# Patient Record
Sex: Female | Born: 1995 | Race: Black or African American | Hispanic: No | Marital: Single | Smoking: Never smoker
Health system: Southern US, Community
[De-identification: ages and names within clinical notes are randomized; demographics above are authoritative.]

## PROBLEM LIST (undated history)

## (undated) DIAGNOSIS — J45909 Unspecified asthma, uncomplicated: Secondary | ICD-10-CM

---
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
## 2013-03-21 HISTORY — PX: THERAPEUTIC ABORTION: SHX798

## 2014-01-16 IMAGING — US US PELVIS COMPLETE
1 series · 14 of 25 positions shown · non-contrast
Comparison: None

CLINICAL DATA: Irregular menstruation, menorrhagia and abdominal
cramping.



[Series 1: us pelvis complete · 0.22mm/px · 14 of 57 slices shown]
[im 1/57]
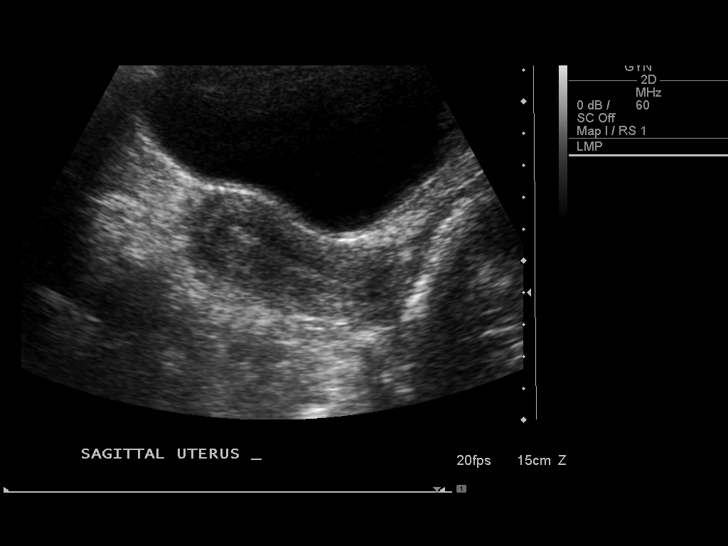
[im 5/57]
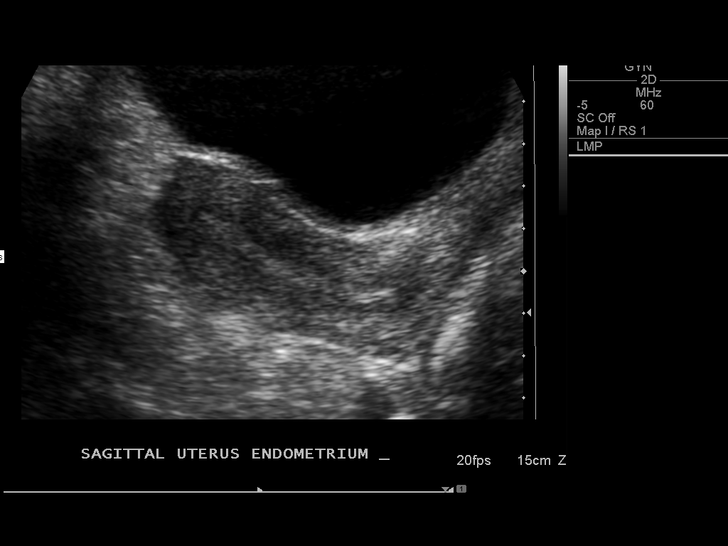
[im 10/57]
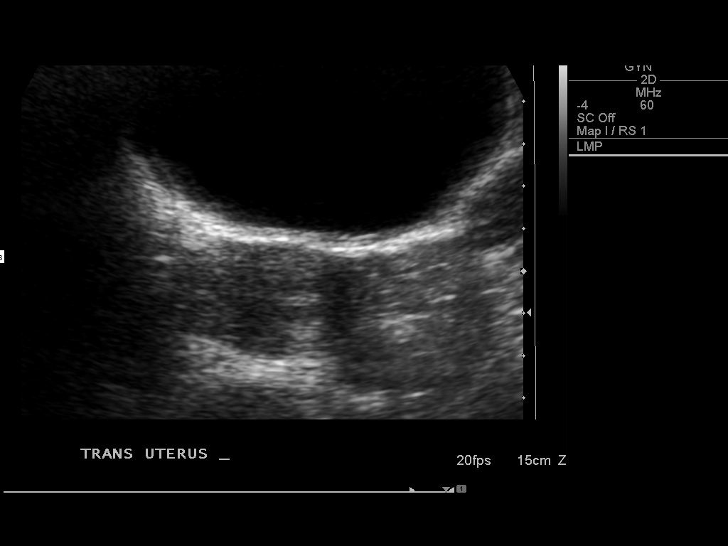
[im 15/57]
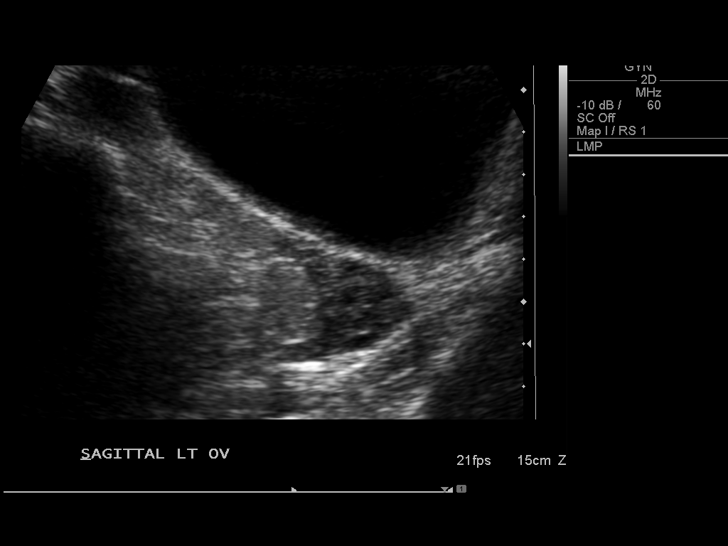
[im 19/57]
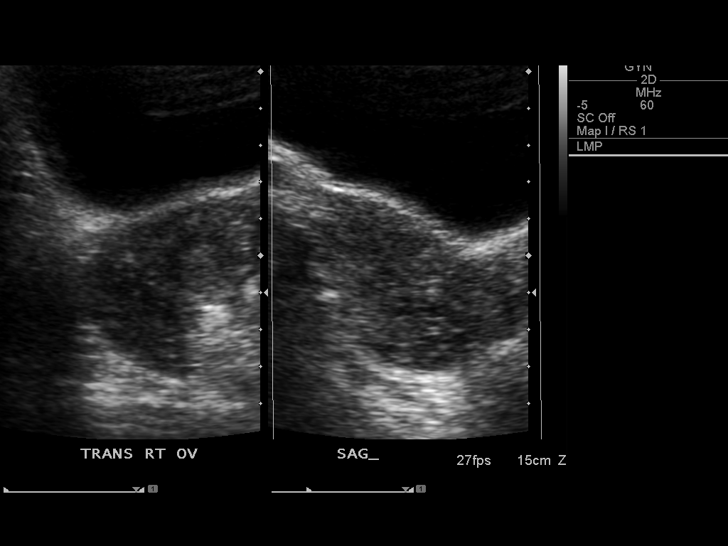
[im 22/57]
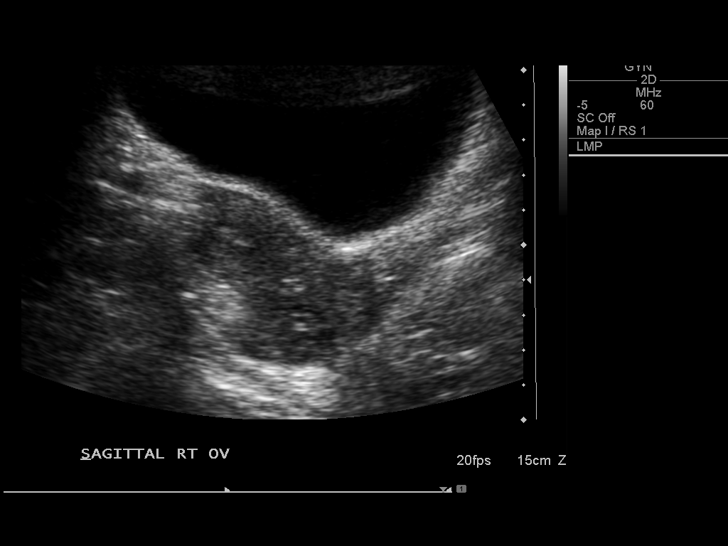
[im 26/57]
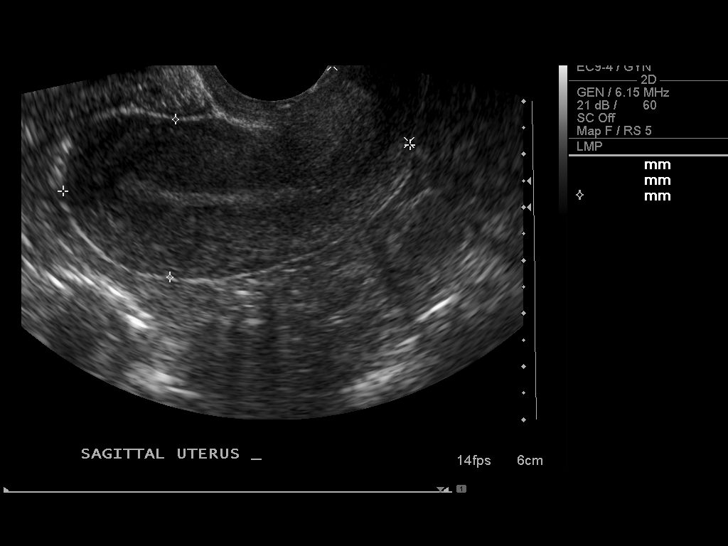
[im 31/57]
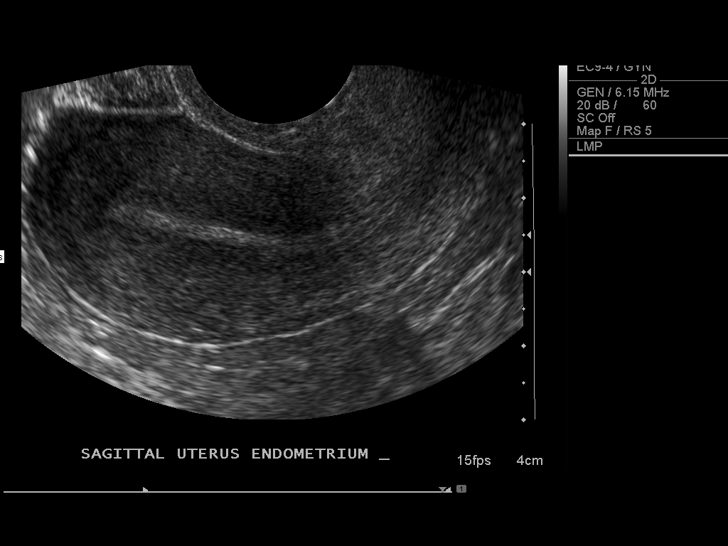
[im 36/57]
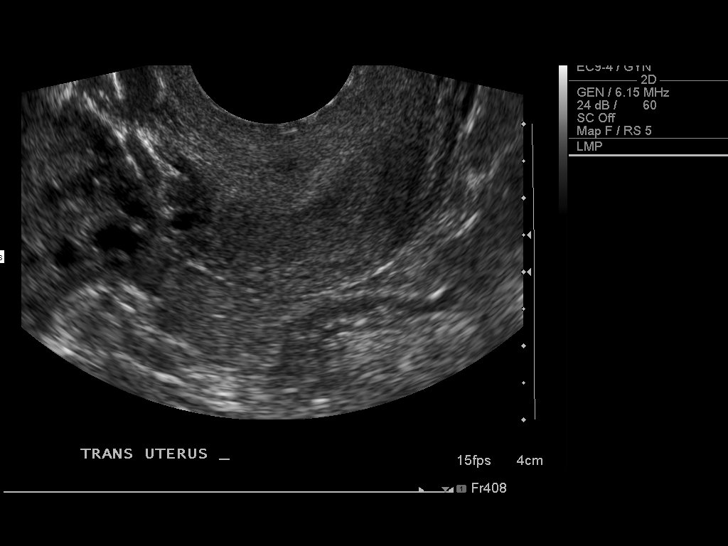
[im 38/57]
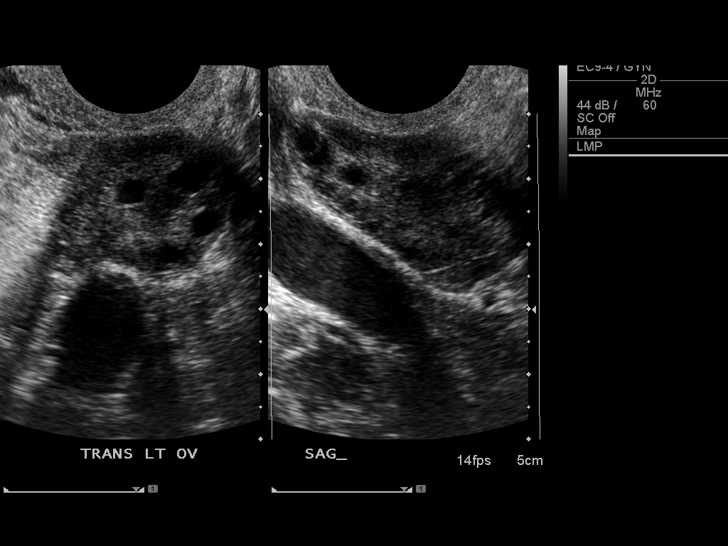
[im 43/57]
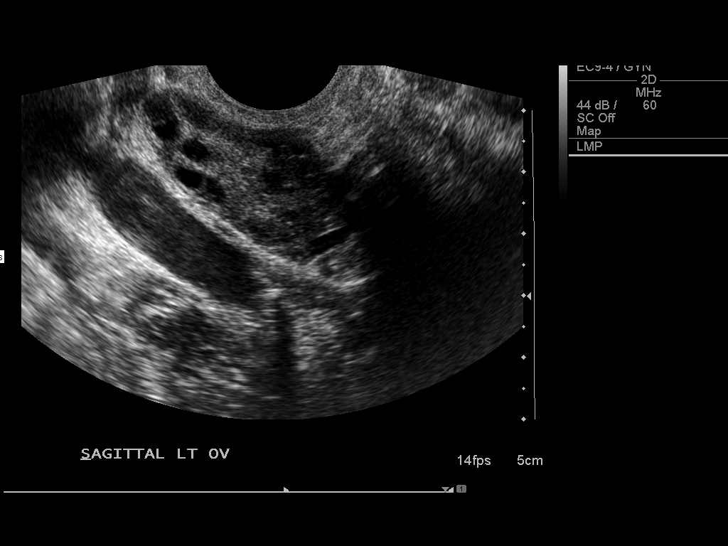
[im 47/57]
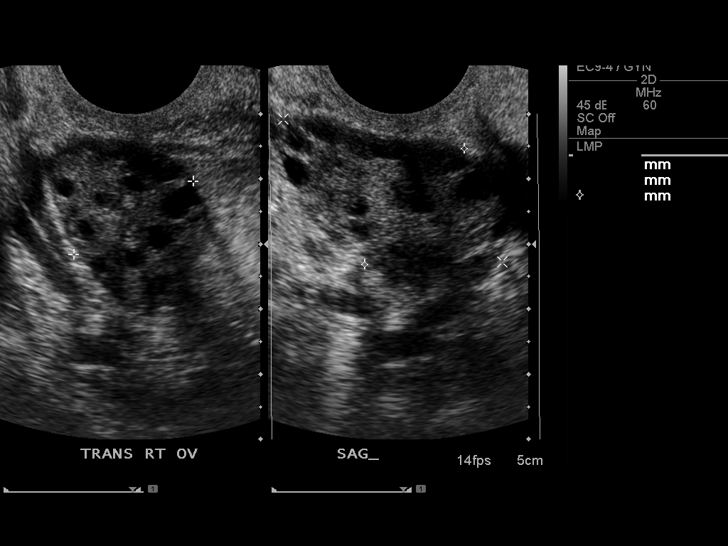
[im 52/57]
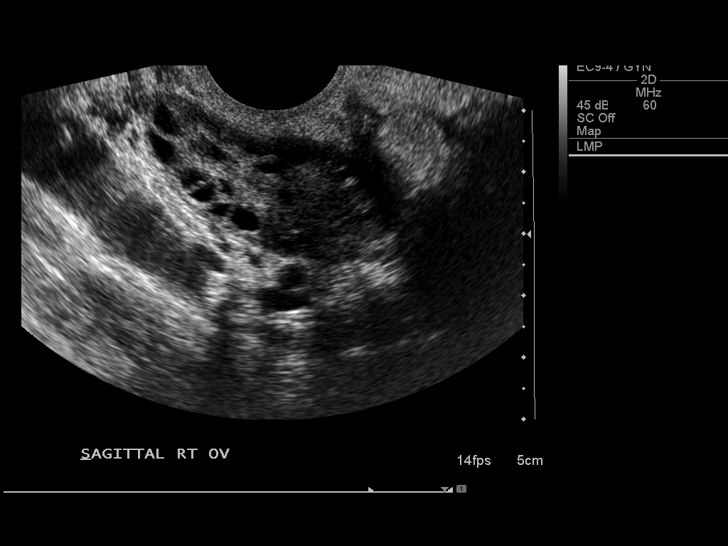
[im 57/57]
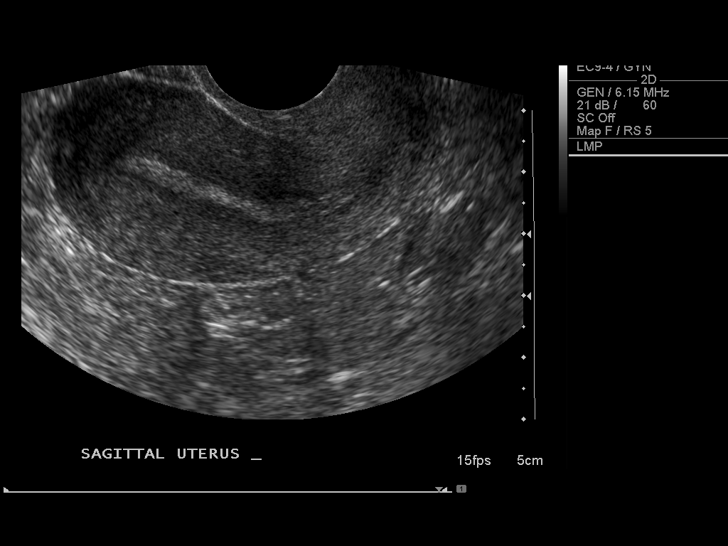

[14 of 25 positions shown; findings below may reference images not displayed]

FINDINGS: Uterus: The uterus measures approximately 6.6 x 3.0 x 4.3 cm and
has a normal sonographic appearance.

Endometrium: Normal, homogeneous endometrium measures 3 mm in
thickness.

Right ovary:  The right ovary measures 4.0 x 2.4 x 2.2 cm and
contains normal follicles.

Left ovary: The left ovary measures 4.1 x 2.0 x 2.6 cm and contains
normal follicles.

No adnexal masses are identified.

Other findings: No free fluid
IMPRESSION: Normal study. No evidence of pelvic mass or other significant
abnormality.

## 2018-03-21 NOTE — L&D Delivery Note (Addendum)
Patient: Tammy Moon MRN: [id number]  GBS status: Positive, IAP given  Patient is a 23 y.o. now G2P1011 s/p NSVD at [redacted]w[redacted]d, who was admitted for IOL due to decreased fetal movement and new onset hypertension. AROM 1h 47m prior to delivery with heavy meconium fluid.    Delivery Note At 12:26 PM a viable female was delivered via Vaginal, Spontaneous (Presentation:Vertex ;ROA ).  APGAR:8, 9 ; weight See delivery summary .   Placenta status: spontaneous , intact.  Cord:3 vessel with the following complications: 1st laceration that required repair with 3-0 Vicryl sutures  Anesthesia: Epidural Episiotomy: None Lacerations: 1st degree Suture Repair: 3.0 vicryl Est. Blood Loss (mL):  200  Mom to postpartum.  Baby to Couplet care / Skin to Skin.  Head delivered ROA. Nuchal cord present and immediately reduced. Shoulder and body delivered in usual fashion. Infant with spontaneous cry, placed on mother's abdomen, dried and bulb suctioned. Cord clamped x 2 after 1-minute delay, and cut by father of the baby member. Cord blood drawn. Placenta delivered spontaneously with gentle cord traction. Fundus firm with massage and Pitocin. Perineum inspected and found to have a first-degree perineal body laceration laceration, which was repaired with 3.0 Vicryl sutures with good hemostasis achieved.  Gifford Shave 01/07/2019, 12:52 PM   OB FELLOW DELIVERY ATTESTATION  I was gloved and present for the delivery in its entirety, and I agree with the above resident's note.    Barrington Ellison, MD [facility] Family Medicine Fellow, [facility] for [employer], [place]

## 2018-07-04 ENCOUNTER — Inpatient Hospital Stay (HOSPITAL_COMMUNITY)
Admission: AD | Admit: 2018-07-04 | Discharge: 2018-07-04 | Disposition: A | Discharge status: Home / Self Care | Payer: BLUE CROSS/BLUE SHIELD | Attending: Obstetrics and Gynecology | Admitting: Obstetrics and Gynecology

## 2018-07-04 ENCOUNTER — Other Ambulatory Visit: Payer: Self-pay

## 2018-07-04 ENCOUNTER — Encounter (HOSPITAL_COMMUNITY): Payer: Self-pay | Admitting: *Deleted

## 2018-07-04 DIAGNOSIS — O23591 Infection of other part of genital tract in pregnancy, first trimester: Secondary | ICD-10-CM | POA: Diagnosis not present

## 2018-07-04 DIAGNOSIS — R109 Unspecified abdominal pain: Secondary | ICD-10-CM | POA: Insufficient documentation

## 2018-07-04 DIAGNOSIS — O9989 Other specified diseases and conditions complicating pregnancy, childbirth and the puerperium: Secondary | ICD-10-CM | POA: Diagnosis not present

## 2018-07-04 DIAGNOSIS — B373 Candidiasis of vulva and vagina: Secondary | ICD-10-CM | POA: Diagnosis not present

## 2018-07-04 DIAGNOSIS — Z3A13 13 weeks gestation of pregnancy: Secondary | ICD-10-CM | POA: Diagnosis not present

## 2018-07-04 DIAGNOSIS — Z79899 Other long term (current) drug therapy: Secondary | ICD-10-CM | POA: Insufficient documentation

## 2018-07-04 DIAGNOSIS — Z91040 Latex allergy status: Secondary | ICD-10-CM | POA: Diagnosis not present

## 2018-07-04 DIAGNOSIS — B3731 Acute candidiasis of vulva and vagina: Secondary | ICD-10-CM

## 2018-07-04 HISTORY — DX: Unspecified asthma, uncomplicated: J45.909

## 2018-07-04 LAB — URINALYSIS, ROUTINE W REFLEX MICROSCOPIC
Bilirubin Urine: NEGATIVE
Glucose, UA: NEGATIVE mg/dL
Hgb urine dipstick: NEGATIVE
Ketones, ur: NEGATIVE mg/dL
Nitrite: NEGATIVE
Protein, ur: NEGATIVE mg/dL
Specific Gravity, Urine: 1.018 (ref 1.005–1.030)
pH: 6 (ref 5.0–8.0)

## 2018-07-04 LAB — WET PREP, GENITAL
Sperm: NONE SEEN
Trich, Wet Prep: NONE SEEN

## 2018-07-04 LAB — POCT PREGNANCY, URINE: Preg Test, Ur: POSITIVE — AB

## 2018-07-04 MED ORDER — TERCONAZOLE 0.4 % VA CREA: 1.0000 | TOPICAL_CREAM | Freq: Every day | VAGINAL | 0 refills | Status: AC

## 2018-07-04 NOTE — MAU Note (Signed)
Pt presents to MAU with complaints of lower abdominal cramping with back pain. Has more vaginal discharge than normal

## 2018-07-04 NOTE — Discharge Instructions (Signed)
[facility]  ° ° ° °[facility]     Phone: [phone] ° °[facility]  Phone: [phone] ° °[facility]  Phone: [phone] ° °[facility]                           Phone: [phone] ° °[facility]          Phone: [phone] ° °[facility]    Phone: [phone]  ° °[facility]    Phone: [phone] ° °[facility]    Phone: [phone] ° °[facility]    Phone: [phone] ° °[facility]    Phone: [phone] ° °[facility]  Phone: [phone] ° °[facility]               Phone: [phone] ° °[facility]   Phone: [phone] ° °[facility]    Phone: [phone] ° ° ° ° ° ° ° ° °  ° ° ° °  ° ° °

## 2018-07-04 NOTE — MAU Provider Note (Signed)
History     CSN: [id number]  Arrival date and time: 07/04/18 1247    Chief Complaint  Patient presents with  . Abdominal Pain   Ms. Tammy Moon is a 23 y.o. G2P0010 at [redacted]w[redacted]d who presents to MAU for vaginal discharge.  Onset: this morning Location: vagina Duration: <24hrs Character: white, thick, clumped Aggravating/Associated: none/pt denies odor and itching Relieving: none Treatment: none Severity: no pain  Pt denies VB. Pt denies N/V, abdominal pain, constipation, diarrhea, or urinary problems. Pt denies fever, chills, fatigue, sweating or changes in appetite. Pt denies SOB or chest pain. Pt denies dizziness, HA, light-headedness, weakness.  Allergies? latex Current medications/supplements? PNVs Prenatal care provider? None, pt seen at [facility] for early US  OB History    Gravida  2   Para      Term      Preterm      AB  1   Living        SAB      TAB  1   Ectopic      Multiple      Live Births              Past Medical History:  Diagnosis Date  . Asthma    as a child     Past Surgical History:  Procedure Laterality Date  . THERAPEUTIC ABORTION  2015    History reviewed. No pertinent family history.  Social History   Tobacco Use  . Smoking status: Never Smoker  . Smokeless tobacco: Never Used  Substance Use Topics  . Alcohol use: Not Currently    Comment: occ  . Drug use: Yes    Types: Marijuana    Comment: last used 1 month ago     Allergies:  Allergies  Allergen Reactions  . Latex Itching    Medications Prior to Admission  Medication Sig Dispense Refill Last Dose  . fluconazole (DIFLUCAN) 150 MG tablet Take 1 tablet (150 mg total) by mouth once. If no relief of itching in 3 days, take one tablet. 1 tablet 1   . loperamide (IMODIUM A-D) 2 MG tablet Take 1 tablet (2 mg total) by mouth 4 (four) times daily as needed for diarrhea or loose stools. 30 tablet 0     Review of Systems  Constitutional:  Negative for appetite change, chills, diaphoresis, fatigue and fever.  Respiratory: Negative for shortness of breath.   Cardiovascular: Negative for chest pain.  Gastrointestinal: Negative for abdominal pain, constipation, diarrhea, nausea and vomiting.  Genitourinary: Positive for vaginal discharge. Negative for dysuria, flank pain, frequency, pelvic pain, urgency and vaginal bleeding.  Neurological: Negative for dizziness, weakness, light-headedness and headaches.   Physical Exam   Blood pressure 131/66, pulse 93, temperature 97.9 F (36.6 C), resp. rate 16, height 5\' 10"  (1.778 m), weight 76.2 kg, last menstrual period 03/31/2018.   Patient Vitals for the past 24 hrs:  BP Temp Pulse Resp Height Weight  07/04/18 1356 131/66 97.9 F (36.6 C) 93 16 5\' 10"  (1.778 m) 76.2 kg    Physical Exam  Constitutional: She is oriented to person, place, and time. She appears well-developed and well-nourished. No distress.  HENT:  Head: Normocephalic and atraumatic.  Respiratory: Effort normal.  Neurological: She is alert and oriented to person, place, and time.  Skin: Skin is warm and dry. She is not diaphoretic.  Psychiatric: She has a normal mood and affect. Her behavior is normal.  FHTs 140s  Results for orders placed  or performed during the hospital encounter of 07/04/18 (from the past 24 hour(s))  Pregnancy, urine POC     Status: Abnormal   Collection Time: 07/04/18  1:29 PM  Result Value Ref Range   Preg Test, Ur POSITIVE (A) NEGATIVE  Urinalysis, Routine w reflex microscopic     Status: Abnormal   Collection Time: 07/04/18  1:43 PM  Result Value Ref Range   Color, Urine YELLOW YELLOW   APPearance HAZY (A) CLEAR   Specific Gravity, Urine 1.018 1.005 - 1.030   pH 6.0 5.0 - 8.0   Glucose, UA NEGATIVE NEGATIVE mg/dL   Hgb urine dipstick NEGATIVE NEGATIVE   Bilirubin Urine NEGATIVE NEGATIVE   Ketones, ur NEGATIVE NEGATIVE mg/dL   Protein, ur NEGATIVE NEGATIVE mg/dL   Nitrite NEGATIVE  NEGATIVE   Leukocytes,Ua SMALL (A) NEGATIVE   RBC / HPF 0-5 0 - 5 RBC/hpf   WBC, UA 6-10 0 - 5 WBC/hpf   Bacteria, UA RARE (A) NONE SEEN   Squamous Epithelial / LPF 0-5 0 - 5   Mucus PRESENT   Wet prep, genital     Status: Abnormal   Collection Time: 07/04/18  3:13 PM  Result Value Ref Range   Yeast Wet Prep HPF POC PRESENT (A) NONE SEEN   Trich, Wet Prep NONE SEEN NONE SEEN   Clue Cells Wet Prep HPF POC PRESENT (A) NONE SEEN   WBC, Wet Prep HPF POC MODERATE (A) NONE SEEN   Sperm NONE SEEN    No results found.  MAU Course  Procedures  MDM -WetPrep (self-swab): +yeast, +ClueCells (isolated findings, no tx necessary), + WBCs -GC/CT collected by self-swab -UA: hazy/small leuks/rare bacteria -pt discharged to home in stable condition  Orders Placed This Encounter  Procedures  . Wet prep, genital    Standing Status:   Standing    Number of Occurrences:   1  . Culture, OB Urine    Standing Status:   Standing    Number of Occurrences:   1  . Urinalysis, Routine w reflex microscopic    Standing Status:   Standing    Number of Occurrences:   1  . Pregnancy, urine POC    Standing Status:   Standing    Number of Occurrences:   1  . Discharge patient    Order Specific Question:   Discharge disposition    Answer:   01-Home or Self Care [1]    Order Specific Question:   Discharge patient date    Answer:   07/04/2018   Meds ordered this encounter  Medications  . terconazole (TERAZOL 7) 0.4 % vaginal cream    Sig: Place 1 applicator vaginally at bedtime for 7 days.    Dispense:  45 g    Refill:  0    Order Specific Question:   Supervising Provider    Answer:   ERVIN, MICHAEL L [[id number]]     Assessment and Plan   1. Vaginal yeast infection    Allergies as of 07/04/2018      Reactions   Latex Itching      Medication List    STOP taking these medications   fluconazole 150 MG tablet Commonly known as:  DIFLUCAN   loperamide 2 MG tablet Commonly known as:  Imodium  A-D     TAKE these medications   terconazole 0.4 % vaginal cream Commonly known as:  TERAZOL 7 Place 1 applicator vaginally at bedtime for 7 days.      -  list of [place] [facility] providers given, pt to schedule visit this week -start PNVs -return MAU precautions given -pt discharged to home in stable condition  Joni Reining E  07/04/2018, 4:16 PM

## 2018-07-05 LAB — GC/CHLAMYDIA PROBE AMP (~~LOC~~) NOT AT ARMC
Chlamydia: NEGATIVE
Neisseria Gonorrhea: NEGATIVE

## 2018-07-06 LAB — CULTURE, OB URINE: Culture: >=100000 — AB

## 2018-07-07 ENCOUNTER — Telehealth: Payer: Self-pay | Admitting: Student

## 2018-07-07 DIAGNOSIS — O2342 Unspecified infection of urinary tract in pregnancy, second trimester: Secondary | ICD-10-CM

## 2018-07-07 MED ORDER — NITROFURANTOIN MONOHYD MACRO 100 MG PO CAPS: 100.0000 mg | ORAL_CAPSULE | Freq: Two times a day (BID) | ORAL | 0 refills | Status: DC

## 2018-07-07 NOTE — Telephone Encounter (Signed)
Verified by name & DOB. Notified of positive urine culture. Abx sent to pharmacy of patient's choice. Allergies reviewed. Patient verbalized understanding & had no further questions.   Judeth Horn, NP

## 2018-07-12 ENCOUNTER — Encounter: Payer: Self-pay | Admitting: Obstetrics and Gynecology

## 2018-07-12 ENCOUNTER — Other Ambulatory Visit: Payer: Self-pay

## 2018-07-12 ENCOUNTER — Ambulatory Visit (INDEPENDENT_AMBULATORY_CARE_PROVIDER_SITE_OTHER): Payer: BLUE CROSS/BLUE SHIELD | Admitting: Obstetrics and Gynecology

## 2018-07-12 VITALS — BP 116/74 | HR 89 | Wt 171.3 lb

## 2018-07-12 DIAGNOSIS — Z3481 Encounter for supervision of other normal pregnancy, first trimester: Secondary | ICD-10-CM | POA: Diagnosis not present

## 2018-07-12 DIAGNOSIS — O2340 Unspecified infection of urinary tract in pregnancy, unspecified trimester: Secondary | ICD-10-CM | POA: Insufficient documentation

## 2018-07-12 DIAGNOSIS — Z124 Encounter for screening for malignant neoplasm of cervix: Secondary | ICD-10-CM

## 2018-07-12 DIAGNOSIS — O2341 Unspecified infection of urinary tract in pregnancy, first trimester: Secondary | ICD-10-CM

## 2018-07-12 DIAGNOSIS — Z3480 Encounter for supervision of other normal pregnancy, unspecified trimester: Secondary | ICD-10-CM | POA: Insufficient documentation

## 2018-07-12 DIAGNOSIS — Z3A14 14 weeks gestation of pregnancy: Secondary | ICD-10-CM

## 2018-07-12 LAB — OB RESULTS CONSOLE HEPATITIS B SURFACE ANTIGEN: Hepatitis B Surface Ag: NEGATIVE

## 2018-07-12 NOTE — Progress Notes (Signed)
Subjective:  Tammy Moon is a 23 y.o. G2P0010 at [redacted]w[redacted]d being seen today for her first OB appt. Certain LMP. No chronic medical problems or medications. Dx with UTI in [place]. Has not started treatment yet. ongoing   She is currently monitored for the following issues for this low-risk pregnancy and has Supervision of other normal pregnancy, antepartum and UTI (urinary tract infection) during pregnancy on their problem list.  Patient reports no complaints.  Contractions: Not present. Vag. Bleeding: None.  Movement: Absent. Denies leaking of fluid.   The following portions of the patient's history were reviewed and updated as appropriate: allergies, current medications, past family history, past medical history, past social history, past surgical history and problem list. Problem list updated.  Objective:   Vitals:   07/12/18 0914  BP: 116/74  Pulse: 89  Weight: 171 lb 4.8 oz (77.7 kg)    Fetal Status: Fetal Heart Rate (bpm): 150   Movement: Absent     General:  Alert, oriented and cooperative. Patient is in no acute distress.  Skin: Skin is warm and dry. No rash noted.   Cardiovascular: Normal heart rate noted  Respiratory: Normal respiratory effort, no problems with respiration noted  Abdomen: Soft, gravid, appropriate for gestational age. Pain/Pressure: Absent     Pelvic:  Cervical exam performed        Extremities: Normal range of motion.  Edema: None  Mental Status: Normal mood and affect. Normal behavior. Normal judgment and thought content.   Urinalysis:      Assessment and Plan:  Pregnancy: G2P0010 at [redacted]w[redacted]d  1. Supervision of other normal pregnancy, antepartum Prenatal care and labs reviewed with pt. - Obstetric Panel, Including HIV - Genetic Screening - CH[facility] AMB BABYSCRIPTS SCHEDULE OPTIMIZATION - Cytology - PAP - [place] MFM OB COMP + 14 WK; Future  2. Urinary tract infection in mother during first trimester of pregnancy Pt encouraged to pick up Rx today  Preterm labor  symptoms and general obstetric precautions including but not limited to vaginal bleeding, contractions, leaking of fluid and fetal movement were reviewed in detail with the patient. Please refer to After Visit Summary for other counseling recommendations.  Return in about 6 weeks (around 08/23/2018) for OB visit, televisit.   Hermina Staggers, MD

## 2018-07-12 NOTE — Progress Notes (Signed)
Pt presents for NOB visit. This is not a planned pregnancy but FOB is supportive.  CT/GC neg 07/04/18 Will pick up UTI meds today

## 2018-07-12 NOTE — Patient Instructions (Signed)
Second Trimester of Pregnancy The second trimester is from week 14 through week 27 (months 4 through 6). The second trimester is often a time when you feel your best. Your body has adjusted to being pregnant, and you begin to feel better physically. Usually, morning sickness has lessened or quit completely, you may have more energy, and you may have an increase in appetite. The second trimester is also a time when the fetus is growing rapidly. At the end of the sixth month, the fetus is about 9 inches long and weighs about 1 pounds. You will likely begin to feel the baby move (quickening) between 16 and 20 weeks of pregnancy. Body changes during your second trimester Your body continues to go through many changes during your second trimester. The changes vary from woman to woman.  Your weight will continue to increase. You will notice your lower abdomen bulging out.  You may begin to get stretch marks on your hips, abdomen, and breasts.  You may develop headaches that can be relieved by medicines. The medicines should be approved by your health care provider.  You may urinate more often because the fetus is pressing on your bladder.  You may develop or continue to have heartburn as a result of your pregnancy.  You may develop constipation because certain hormones are causing the muscles that push waste through your intestines to slow down.  You may develop hemorrhoids or swollen, bulging veins (varicose veins).  You may have back pain. This is caused by: ? Weight gain. ? Pregnancy hormones that are relaxing the joints in your pelvis. ? A shift in weight and the muscles that support your balance.  Your breasts will continue to grow and they will continue to become tender.  Your gums may bleed and may be sensitive to brushing and flossing.  Dark spots or blotches (chloasma, mask of pregnancy) may develop on your face. This will likely fade after the baby is born.  A dark line from your  belly button to the pubic area (linea nigra) may appear. This will likely fade after the baby is born.  You may have changes in your hair. These can include thickening of your hair, rapid growth, and changes in texture. Some women also have hair loss during or after pregnancy, or hair that feels dry or thin. Your hair will most likely return to normal after your baby is born. What to expect at prenatal visits During a routine prenatal visit:  You will be weighed to make sure you and the fetus are growing normally.  Your blood pressure will be taken.  Your abdomen will be measured to track your baby's growth.  The fetal heartbeat will be listened to.  Any test results from the previous visit will be discussed. Your health care provider may ask you:  How you are feeling.  If you are feeling the baby move.  If you have had any abnormal symptoms, such as leaking fluid, bleeding, severe headaches, or abdominal cramping.  If you are using any tobacco products, including cigarettes, chewing tobacco, and electronic cigarettes.  If you have any questions. Other tests that may be performed during your second trimester include:  Blood tests that check for: ? Low iron levels (anemia). ? High blood sugar that affects pregnant women (gestational diabetes) between 35 and 28 weeks. ? Rh antibodies. This is to check for a protein on red blood cells (Rh factor).  Urine tests to check for infections, diabetes, or protein in the  urine.  An ultrasound to confirm the proper growth and development of the baby.  An amniocentesis to check for possible genetic problems.  Fetal screens for spina bifida and Down syndrome.  HIV (human immunodeficiency virus) testing. Routine prenatal testing includes screening for HIV, unless you choose not to have this test. Follow these instructions at home: Medicines  Follow your health care provider's instructions regarding medicine use. Specific medicines may be  either safe or unsafe to take during pregnancy.  Take a prenatal vitamin that contains at least 600 micrograms (mcg) of folic acid.  If you develop constipation, try taking a stool softener if your health care provider approves. Eating and drinking   Eat a balanced diet that includes fresh fruits and vegetables, whole grains, good sources of protein such as meat, eggs, or tofu, and low-fat dairy. Your health care provider will help you determine the amount of weight gain that is right for you.  Avoid raw meat and uncooked cheese. These carry germs that can cause birth defects in the baby.  If you have low calcium intake from food, talk to your health care provider about whether you should take a daily calcium supplement.  Limit foods that are high in fat and processed sugars, such as fried and sweet foods.  To prevent constipation: ? Drink enough fluid to keep your urine clear or pale yellow. ? Eat foods that are high in fiber, such as fresh fruits and vegetables, whole grains, and beans. Activity  Exercise only as directed by your health care provider. Most women can continue their usual exercise routine during pregnancy. Try to exercise for 30 minutes at least 5 days a week. Stop exercising if you experience uterine contractions.  Avoid heavy lifting, wear low heel shoes, and practice good posture.  A sexual relationship may be continued unless your health care provider directs you otherwise. Relieving pain and discomfort  Wear a good support bra to prevent discomfort from breast tenderness.  Take warm sitz baths to soothe any pain or discomfort caused by hemorrhoids. Use hemorrhoid cream if your health care provider approves.  Rest with your legs elevated if you have leg cramps or low back pain.  If you develop varicose veins, wear support hose. Elevate your feet for 15 minutes, 3-4 times a day. Limit salt in your diet. Prenatal Care  Write down your questions. Take them to  your prenatal visits.  Keep all your prenatal visits as told by your health care provider. This is important. Safety  Wear your seat belt at all times when driving.  Make a list of emergency phone numbers, including numbers for family, friends, the hospital, and police and fire departments. General instructions  Ask your health care provider for a referral to a local prenatal education class. Begin classes no later than the beginning of month 6 of your pregnancy.  Ask for help if you have counseling or nutritional needs during pregnancy. Your health care provider can offer advice or refer you to specialists for help with various needs.  Do not use hot tubs, steam rooms, or saunas.  Do not douche or use tampons or scented sanitary pads.  Do not cross your legs for long periods of time.  Avoid cat litter boxes and soil used by cats. These carry germs that can cause birth defects in the baby and possibly loss of the fetus by miscarriage or stillbirth.  Avoid all smoking, herbs, alcohol, and unprescribed drugs. Chemicals in these products can affect the formation  and growth of the baby.  Do not use any products that contain nicotine or tobacco, such as cigarettes and e-cigarettes. If you need help quitting, ask your health care provider.  Visit your dentist if you have not gone yet during your pregnancy. Use a soft toothbrush to brush your teeth and be gentle when you floss. Contact a health care provider if:  You have dizziness.  You have mild pelvic cramps, pelvic pressure, or nagging pain in the abdominal area.  You have persistent nausea, vomiting, or diarrhea.  You have a bad smelling vaginal discharge.  You have pain when you urinate. Get help right away if:  You have a fever.  You are leaking fluid from your vagina.  You have spotting or bleeding from your vagina.  You have severe abdominal cramping or pain.  You have rapid weight gain or weight loss.  You have  shortness of breath with chest pain.  You notice sudden or extreme swelling of your face, hands, ankles, feet, or legs.  You have not felt your baby move in over an hour.  You have severe headaches that do not go away when you take medicine.  You have vision changes. Summary  The second trimester is from week 14 through week 27 (months 4 through 6). It is also a time when the fetus is growing rapidly.  Your body goes through many changes during pregnancy. The changes vary from woman to woman.  Avoid all smoking, herbs, alcohol, and unprescribed drugs. These chemicals affect the formation and growth your baby.  Do not use any tobacco products, such as cigarettes, chewing tobacco, and e-cigarettes. If you need help quitting, ask your health care provider.  Contact your health care provider if you have any questions. Keep all prenatal visits as told by your health care provider. This is important. This information is not intended to replace advice given to you by your health care provider. Make sure you discuss any questions you have with your health care provider. Document Released: 03/01/2001 Document Revised: 04/12/2016 Document Reviewed: 04/12/2016 Elsevier Interactive Patient Education  2019 [employer] First Trimester of Pregnancy The first trimester of pregnancy is from week 1 until the end of week 13 (months 1 through 3). A week after a sperm fertilizes an egg, the egg will implant on the wall of the uterus. This embryo will begin to develop into a baby. Genes from you and your partner will form the baby. The female genes will determine whether the baby will be a boy or a girl. At 6-8 weeks, the eyes and face will be formed, and the heartbeat can be seen on ultrasound. At the end of 12 weeks, all the baby's organs will be formed. Now that you are pregnant, you will want to do everything you can to have a healthy baby. Two of the most important things are to get good prenatal care and  to follow your health care provider's instructions. Prenatal care is all the medical care you receive before the baby's birth. This care will help prevent, find, and treat any problems during the pregnancy and childbirth. Body changes during your first trimester Your body goes through many changes during pregnancy. The changes vary from woman to woman.  You may gain or lose a couple of pounds at first.  You may feel sick to your stomach (nauseous) and you may throw up (vomit). If the vomiting is uncontrollable, call your health care provider.  You may tire easily.  You  may develop headaches that can be relieved by medicines. All medicines should be approved by your health care provider.  You may urinate more often. Painful urination may mean you have a bladder infection.  You may develop heartburn as a result of your pregnancy.  You may develop constipation because certain hormones are causing the muscles that push stool through your intestines to slow down.  You may develop hemorrhoids or swollen veins (varicose veins).  Your breasts may begin to grow larger and become tender. Your nipples may stick out more, and the tissue that surrounds them (areola) may become darker.  Your gums may bleed and may be sensitive to brushing and flossing.  Dark spots or blotches (chloasma, mask of pregnancy) may develop on your face. This will likely fade after the baby is born.  Your menstrual periods will stop.  You may have a loss of appetite.  You may develop cravings for certain kinds of food.  You may have changes in your emotions from day to day, such as being excited to be pregnant or being concerned that something may go wrong with the pregnancy and baby.  You may have more vivid and strange dreams.  You may have changes in your hair. These can include thickening of your hair, rapid growth, and changes in texture. Some women also have hair loss during or after pregnancy, or hair that  feels dry or thin. Your hair will most likely return to normal after your baby is born. What to expect at prenatal visits During a routine prenatal visit:  You will be weighed to make sure you and the baby are growing normally.  Your blood pressure will be taken.  Your abdomen will be measured to track your baby's growth.  The fetal heartbeat will be listened to between weeks 10 and 14 of your pregnancy.  Test results from any previous visits will be discussed. Your health care provider may ask you:  How you are feeling.  If you are feeling the baby move.  If you have had any abnormal symptoms, such as leaking fluid, bleeding, severe headaches, or abdominal cramping.  If you are using any tobacco products, including cigarettes, chewing tobacco, and electronic cigarettes.  If you have any questions. Other tests that may be performed during your first trimester include:  Blood tests to find your blood type and to check for the presence of any previous infections. The tests will also be used to check for low iron levels (anemia) and protein on red blood cells (Rh antibodies). Depending on your risk factors, or if you previously had diabetes during pregnancy, you may have tests to check for high blood sugar that affects pregnant women (gestational diabetes).  Urine tests to check for infections, diabetes, or protein in the urine.  An ultrasound to confirm the proper growth and development of the baby.  Fetal screens for spinal cord problems (spina bifida) and Down syndrome.  HIV (human immunodeficiency virus) testing. Routine prenatal testing includes screening for HIV, unless you choose not to have this test.  You may need other tests to make sure you and the baby are doing well. Follow these instructions at home: Medicines  Follow your health care provider's instructions regarding medicine use. Specific medicines may be either safe or unsafe to take during pregnancy.  Take a  prenatal vitamin that contains at least 600 micrograms (mcg) of folic acid.  If you develop constipation, try taking a stool softener if your health care provider approves. Eating  and drinking   Eat a balanced diet that includes fresh fruits and vegetables, whole grains, good sources of protein such as meat, eggs, or tofu, and low-fat dairy. Your health care provider will help you determine the amount of weight gain that is right for you.  Avoid raw meat and uncooked cheese. These carry germs that can cause birth defects in the baby.  Eating four or five small meals rather than three large meals a day may help relieve nausea and vomiting. If you start to feel nauseous, eating a few soda crackers can be helpful. Drinking liquids between meals, instead of during meals, also seems to help ease nausea and vomiting.  Limit foods that are high in fat and processed sugars, such as fried and sweet foods.  To prevent constipation: ? Eat foods that are high in fiber, such as fresh fruits and vegetables, whole grains, and beans. ? Drink enough fluid to keep your urine clear or pale yellow. Activity  Exercise only as directed by your health care provider. Most women can continue their usual exercise routine during pregnancy. Try to exercise for 30 minutes at least 5 days a week. Exercising will help you: ? Control your weight. ? Stay in shape. ? Be prepared for labor and delivery.  Experiencing pain or cramping in the lower abdomen or lower back is a good sign that you should stop exercising. Check with your health care provider before continuing with normal exercises.  Try to avoid standing for long periods of time. Move your legs often if you must stand in one place for a long time.  Avoid heavy lifting.  Wear low-heeled shoes and practice good posture.  You may continue to have sex unless your health care provider tells you not to. Relieving pain and discomfort  Wear a good support bra to  relieve breast tenderness.  Take warm sitz baths to soothe any pain or discomfort caused by hemorrhoids. Use hemorrhoid cream if your health care provider approves.  Rest with your legs elevated if you have leg cramps or low back pain.  If you develop varicose veins in your legs, wear support hose. Elevate your feet for 15 minutes, 3-4 times a day. Limit salt in your diet. Prenatal care  Schedule your prenatal visits by the twelfth week of pregnancy. They are usually scheduled monthly at first, then more often in the last 2 months before delivery.  Write down your questions. Take them to your prenatal visits.  Keep all your prenatal visits as told by your health care provider. This is important. Safety  Wear your seat belt at all times when driving.  Make a list of emergency phone numbers, including numbers for family, friends, the hospital, and police and fire departments. General instructions  Ask your health care provider for a referral to a local prenatal education class. Begin classes no later than the beginning of month 6 of your pregnancy.  Ask for help if you have counseling or nutritional needs during pregnancy. Your health care provider can offer advice or refer you to specialists for help with various needs.  Do not use hot tubs, steam rooms, or saunas.  Do not douche or use tampons or scented sanitary pads.  Do not cross your legs for long periods of time.  Avoid cat litter boxes and soil used by cats. These carry germs that can cause birth defects in the baby and possibly loss of the fetus by miscarriage or stillbirth.  Avoid all smoking, herbs, alcohol,  and medicines not prescribed by your health care provider. Chemicals in these products affect the formation and growth of the baby.  Do not use any products that contain nicotine or tobacco, such as cigarettes and e-cigarettes. If you need help quitting, ask your health care provider. You may receive counseling support  and other resources to help you quit.  Schedule a dentist appointment. At home, brush your teeth with a soft toothbrush and be gentle when you floss. Contact a health care provider if:  You have dizziness.  You have mild pelvic cramps, pelvic pressure, or nagging pain in the abdominal area.  You have persistent nausea, vomiting, or diarrhea.  You have a bad smelling vaginal discharge.  You have pain when you urinate.  You notice increased swelling in your face, hands, legs, or ankles.  You are exposed to fifth disease or chickenpox.  You are exposed to [place] measles (rubella) and have never had it. Get help right away if:  You have a fever.  You are leaking fluid from your vagina.  You have spotting or bleeding from your vagina.  You have severe abdominal cramping or pain.  You have rapid weight gain or loss.  You vomit blood or material that looks like coffee grounds.  You develop a severe headache.  You have shortness of breath.  You have any kind of trauma, such as from a fall or a car accident. Summary  The first trimester of pregnancy is from week 1 until the end of week 13 (months 1 through 3).  Your body goes through many changes during pregnancy. The changes vary from woman to woman.  You will have routine prenatal visits. During those visits, your health care provider will examine you, discuss any test results you may have, and talk with you about how you are feeling. This information is not intended to replace advice given to you by your health care provider. Make sure you discuss any questions you have with your health care provider. Document Released: 03/01/2001 Document Revised: 02/17/2016 Document Reviewed: 02/17/2016 Elsevier Interactive Patient Education  2019 [employer].

## 2018-07-13 LAB — OBSTETRIC PANEL, INCLUDING HIV
Antibody Screen: NEGATIVE
Basophils Absolute: 0.1 10*3/uL (ref 0.0–0.2)
Basos: 1 %
EOS (ABSOLUTE): 0.3 10*3/uL (ref 0.0–0.4)
Eos: 3 %
HIV Screen 4th Generation wRfx: NONREACTIVE
Hematocrit: 36.7 % (ref 34.0–46.6)
Hemoglobin: 12.1 g/dL (ref 11.1–15.9)
Hepatitis B Surface Ag: NEGATIVE
Immature Grans (Abs): 0 10*3/uL (ref 0.0–0.1)
Immature Granulocytes: 0 %
Lymphocytes Absolute: 2 10*3/uL (ref 0.7–3.1)
Lymphs: 21 %
MCH: 28.5 pg (ref 26.6–33.0)
MCHC: 33 g/dL (ref 31.5–35.7)
MCV: 86 fL (ref 79–97)
Monocytes Absolute: 0.6 10*3/uL (ref 0.1–0.9)
Monocytes: 6 %
Neutrophils Absolute: 6.4 10*3/uL (ref 1.4–7.0)
Neutrophils: 69 %
Platelets: 279 10*3/uL (ref 150–450)
RBC: 4.25 x10E6/uL (ref 3.77–5.28)
RDW: 12.1 % (ref 11.7–15.4)
RPR Ser Ql: NONREACTIVE
Rh Factor: POSITIVE
Rubella Antibodies, IGG: 5.82 index (ref >0.99)
WBC: 9.3 10*3/uL (ref 3.4–10.8)

## 2018-07-13 LAB — CYTOLOGY - PAP: Diagnosis: NEGATIVE

## 2018-07-20 ENCOUNTER — Encounter: Payer: Self-pay | Admitting: Obstetrics and Gynecology

## 2018-07-23 ENCOUNTER — Encounter: Payer: Self-pay | Admitting: Obstetrics and Gynecology

## 2018-07-30 ENCOUNTER — Encounter: Payer: Self-pay | Admitting: Obstetrics and Gynecology

## 2018-08-02 ENCOUNTER — Other Ambulatory Visit: Payer: Self-pay | Admitting: *Deleted

## 2018-08-02 DIAGNOSIS — N760 Acute vaginitis: Secondary | ICD-10-CM

## 2018-08-02 DIAGNOSIS — B9689 Other specified bacterial agents as the cause of diseases classified elsewhere: Secondary | ICD-10-CM

## 2018-08-02 MED ORDER — METRONIDAZOLE 500 MG PO TABS: 500.0000 mg | ORAL_TABLET | Freq: Two times a day (BID) | ORAL | 0 refills | Status: DC

## 2018-08-02 NOTE — Progress Notes (Signed)
Pt called to office with yellowish d/c and vaginal odor. Pt did have +clue cells at last vaginal swab. Flagyl was sent in today per protocol.  Pt advised to contact office if no change once treated.

## 2018-08-14 ENCOUNTER — Other Ambulatory Visit: Payer: Self-pay

## 2018-08-14 ENCOUNTER — Ambulatory Visit (HOSPITAL_COMMUNITY)
Admission: RE | Admit: 2018-08-14 | Discharge: 2018-08-14 | Disposition: A | Discharge status: Home / Self Care | Payer: BLUE CROSS/BLUE SHIELD | Source: Ambulatory Visit | Attending: Obstetrics and Gynecology | Admitting: Obstetrics and Gynecology

## 2018-08-14 ENCOUNTER — Other Ambulatory Visit (HOSPITAL_COMMUNITY): Payer: Self-pay | Admitting: *Deleted

## 2018-08-14 DIAGNOSIS — Z3A19 19 weeks gestation of pregnancy: Secondary | ICD-10-CM | POA: Diagnosis not present

## 2018-08-14 DIAGNOSIS — Z3480 Encounter for supervision of other normal pregnancy, unspecified trimester: Secondary | ICD-10-CM | POA: Insufficient documentation

## 2018-08-14 DIAGNOSIS — Z363 Encounter for antenatal screening for malformations: Secondary | ICD-10-CM | POA: Diagnosis not present

## 2018-08-14 DIAGNOSIS — Z362 Encounter for other antenatal screening follow-up: Secondary | ICD-10-CM

## 2018-08-23 ENCOUNTER — Encounter: Payer: BLUE CROSS/BLUE SHIELD | Admitting: Obstetrics and Gynecology

## 2018-08-29 ENCOUNTER — Encounter: Payer: Self-pay | Admitting: Certified Nurse Midwife

## 2018-08-29 ENCOUNTER — Ambulatory Visit (INDEPENDENT_AMBULATORY_CARE_PROVIDER_SITE_OTHER): Payer: BLUE CROSS/BLUE SHIELD | Admitting: Certified Nurse Midwife

## 2018-08-29 VITALS — BP 104/60 | HR 83

## 2018-08-29 DIAGNOSIS — Z3480 Encounter for supervision of other normal pregnancy, unspecified trimester: Secondary | ICD-10-CM

## 2018-08-29 DIAGNOSIS — F329 Major depressive disorder, single episode, unspecified: Secondary | ICD-10-CM

## 2018-08-29 DIAGNOSIS — F32A Depression, unspecified: Secondary | ICD-10-CM

## 2018-08-29 DIAGNOSIS — O99342 Other mental disorders complicating pregnancy, second trimester: Secondary | ICD-10-CM

## 2018-08-29 DIAGNOSIS — Z3A21 21 weeks gestation of pregnancy: Secondary | ICD-10-CM

## 2018-08-29 MED ORDER — SERTRALINE HCL 25 MG PO TABS: 25.0000 mg | ORAL_TABLET | Freq: Every day | ORAL | 2 refills | Status: DC

## 2018-08-29 NOTE — Progress Notes (Signed)
I connected with Tammy Moon on 08/29/18 at  9:00 AM EDT by telephone and verified that I am speaking with the correct person using two identifiers. Pt does not have storage to download Webex. She prefers a International aid/development worker.   Pt is experiencing facial breakouts. Pt c/o feeling sad, depressed, and anxiety.  Denies suicidal/homicidal thoughts She also c/o difficulty sleeping

## 2018-08-29 NOTE — Patient Instructions (Signed)
Glucose Tolerance Test During Pregnancy Why am I having this test? The glucose tolerance test (GTT) is done to check how your body processes sugar (glucose). This is one of several tests used to diagnose diabetes that develops during pregnancy (gestational diabetes mellitus). Gestational diabetes is a temporary form of diabetes that some women develop during pregnancy. It usually occurs during the second trimester of pregnancy and goes away after delivery. Testing (screening) for gestational diabetes usually occurs between 24 and 28 weeks of pregnancy. You may have the GTT test after having a 1-hour glucose screening test if the results from that test indicate that you may have gestational diabetes. You may also have this test if:  You have a history of gestational diabetes.  You have a history of giving birth to very large babies or have experienced repeated fetal loss (stillbirth).  You have signs and symptoms of diabetes, such as: ? Changes in your vision. ? Tingling or numbness in your hands or feet. ? Changes in hunger, thirst, and urination that are not otherwise explained by your pregnancy. What is being tested? This test measures the amount of glucose in your blood at different times during a period of 3 hours. This indicates how well your body is able to process glucose. What kind of sample is taken?  Blood samples are required for this test. They are usually collected by inserting a needle into a blood vessel. How do I prepare for this test?  For 3 days before your test, eat normally. Have plenty of carbohydrate-rich foods.  Follow instructions from your health care provider about: ? Eating or drinking restrictions on the day of the test. You may be asked to not eat or drink anything other than water (fast) starting 8-10 hours before the test. ? Changing or stopping your regular medicines. Some medicines may interfere with this test. Tell a health care provider about:  All  medicines you are taking, including vitamins, herbs, eye drops, creams, and over-the-counter medicines.  Any blood disorders you have.  Any surgeries you have had.  Any medical conditions you have. What happens during the test? First, your blood glucose will be measured. This is referred to as your fasting blood glucose, since you fasted before the test. Then, you will drink a glucose solution that contains a certain amount of glucose. Your blood glucose will be measured again 1, 2, and 3 hours after drinking the solution. This test takes about 3 hours to complete. You will need to stay at the testing location during this time. During the testing period:  Do not eat or drink anything other than the glucose solution.  Do not exercise.  Do not use any products that contain nicotine or tobacco, such as cigarettes and e-cigarettes. If you need help stopping, ask your health care provider. The testing procedure may vary among health care providers and hospitals. How are the results reported? Your results will be reported as milligrams of glucose per deciliter of blood (mg/dL) or millimoles per liter (mmol/L). Your health care provider will compare your results to normal ranges that were established after testing a large group of people (reference ranges). Reference ranges may vary among labs and hospitals. For this test, common reference ranges are:  Fasting: less than 95-105 mg/dL (5.3-5.8 mmol/L).  1 hour after drinking glucose: less than 180-190 mg/dL (10.0-10.5 mmol/L).  2 hours after drinking glucose: less than 155-165 mg/dL (8.6-9.2 mmol/L).  3 hours after drinking glucose: 140-145 mg/dL (7.8-8.1 mmol/L). What do the   results mean? Results within reference ranges are considered normal, meaning that your glucose levels are well-controlled. If two or more of your blood glucose levels are high, you may be diagnosed with gestational diabetes. If only one level is high, your health care  provider may suggest repeat testing or other tests to confirm a diagnosis. Talk with your health care provider about what your results mean. Questions to ask your health care provider Ask your health care provider, or the department that is doing the test:  When will my results be ready?  How will I get my results?  What are my treatment options?  What other tests do I need?  What are my next steps? Summary  The glucose tolerance test (GTT) is one of several tests used to diagnose diabetes that develops during pregnancy (gestational diabetes mellitus). Gestational diabetes is a temporary form of diabetes that some women develop during pregnancy.  You may have the GTT test after having a 1-hour glucose screening test if the results from that test indicate that you may have gestational diabetes. You may also have this test if you have any symptoms or risk factors for gestational diabetes.  Talk with your health care provider about what your results mean. This information is not intended to replace advice given to you by your health care provider. Make sure you discuss any questions you have with your health care provider. Document Released: 09/06/2011 Document Revised: 10/17/2016 Document Reviewed: 10/17/2016 Elsevier Interactive Patient Education  2019 Elsevier Inc.  

## 2018-08-29 NOTE — Progress Notes (Addendum)
TELEHEALTH VIRTUAL OBSTETRICS VISIT ENCOUNTER NOTE  I connected with Tammy MisAlexis Gitto on 08/29/18 at 9:27 AM EDT by telephone at home and verified that I am speaking with the correct person using two identifiers.   I discussed the limitations, risks, security and privacy concerns of performing an evaluation and management service by telephone and the availability of in person appointments. I also discussed with the patient that there may be a patient responsible charge related to this service. The patient expressed understanding and agreed to proceed.  Subjective:  Tammy Mislexis Moon is a 23 y.o. G2P0010 at 5322w4d being followed for ongoing prenatal care.  She is currently monitored for the following issues for this low-risk pregnancy and has Supervision of other normal pregnancy, antepartum and UTI (urinary tract infection) during pregnancy on their problem list.  Patient reports depression, anxiety and sadness. Reports fetal movement. Denies any contractions, bleeding or leaking of fluid.   The following portions of the patient's history were reviewed and updated as appropriate: allergies, current medications, past family history, past medical history, past social history, past surgical history and problem list.   Objective:   General:  Alert, oriented and cooperative.   Mental Status: Normal mood and affect perceived. Normal judgment and thought content.  Rest of physical exam deferred due to type of encounter  Assessment and Plan:  Pregnancy: G2P0010 at 822w4d 1. Supervision of other normal pregnancy, antepartum - BP 110/70 today, taken while on phone - Encouraged use of babyscripts app and importance on entering BP into app weekly, patient reports she did not know she needed to be logging it in but is taking weekly and will start logging.  - Routine prenatal care - Anticipatory guidance on upcoming appointment with GTT at next visit, discussed importance of fasting prior to appointment- patient  verbalizes understanding  - Educated and discussed facial routine, patient reports occasional "break outs of face in rash and bumps", educated on changes of hormones during pregnancy - Discussed importance of using soap and water to wash face and staying away from products that can cause additional breakouts   2. 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 - Patient reports new onset of depression, anxiety and sadness  - Describes as using being "a tough person but now is soft", takes feelings out on partner, sometimes does not get out of bed or want to get out of bed to perform daily activities  - Discussed support symptom, patient reports all of family [place] and only boyfriend is here but he is great support  - Asked patient how she felt being started on medication and to see our behavioral health specialist, patient is open to both- educated on the importance of talking everything out and medication in order to feel somewhat normal again.  - Ambulatory referral to Integrated Behavioral Health - sertraline (ZOLOFT) 25 MG tablet; Take 1 tablet (25 mg total) by mouth daily.  Dispense: 30 tablet; Refill: 2  Preterm labor symptoms and general obstetric precautions including but not limited to vaginal bleeding, contractions, leaking of fluid and fetal movement were reviewed in detail with the patient.  I discussed the assessment and treatment plan with the patient. The patient was provided an opportunity to ask questions and all were answered. The patient agreed with the plan and demonstrated an understanding of the instructions. The patient was advised to call back or seek an in-person office evaluation/[facility] for any urgent or concerning symptoms. Please refer to  After Visit Summary for other counseling recommendations.   I provided 18 minutes of non-face-to-face time during this encounter.  Return in about 6 weeks (around 10/10/2018) for ROB/2hrGTT.   Future Appointments  Date Time Provider [place]  09/11/2018 10:45 AM [place] 2 [facility]US  10/11/2018  8:30 AM [facility] None  10/11/2018  8:45 AM Lajean Manes, CNM [facility] None    Lajean Manes, [place] [facility] [place]

## 2018-09-11 ENCOUNTER — Ambulatory Visit (HOSPITAL_COMMUNITY)
Admission: RE | Admit: 2018-09-11 | Discharge: 2018-09-11 | Disposition: A | Discharge status: Home / Self Care | Payer: BC Managed Care – PPO | Source: Ambulatory Visit | Attending: Obstetrics and Gynecology | Admitting: Obstetrics and Gynecology

## 2018-09-11 ENCOUNTER — Other Ambulatory Visit: Payer: Self-pay

## 2018-09-11 DIAGNOSIS — Z3A23 23 weeks gestation of pregnancy: Secondary | ICD-10-CM

## 2018-09-11 DIAGNOSIS — Z148 Genetic carrier of other disease: Secondary | ICD-10-CM

## 2018-09-11 DIAGNOSIS — Z362 Encounter for other antenatal screening follow-up: Secondary | ICD-10-CM | POA: Diagnosis present

## 2018-09-12 ENCOUNTER — Ambulatory Visit: Payer: Self-pay | Admitting: Clinical

## 2018-09-12 NOTE — BH Specialist Note (Signed)
Pt did not arrive to [facility] video, and did not answer the phone. Pt does not have MyChart; Left HIPPA-compliant message to call back Roselyn Reef from [facility] for [employer] at [phone].   Integrated Behavioral Health Visit via Telemedicine (Telephone)  09/12/2018 [name] [phone]   [place]

## 2018-10-11 ENCOUNTER — Other Ambulatory Visit: Payer: BC Managed Care – PPO

## 2018-10-11 ENCOUNTER — Ambulatory Visit (INDEPENDENT_AMBULATORY_CARE_PROVIDER_SITE_OTHER): Payer: BC Managed Care – PPO | Admitting: Certified Nurse Midwife

## 2018-10-11 ENCOUNTER — Other Ambulatory Visit: Payer: Self-pay

## 2018-10-11 ENCOUNTER — Encounter: Payer: Self-pay | Admitting: Certified Nurse Midwife

## 2018-10-11 VITALS — BP 121/84 | HR 79 | Temp 98.1°F | Wt 191.0 lb

## 2018-10-11 DIAGNOSIS — O2342 Unspecified infection of urinary tract in pregnancy, second trimester: Secondary | ICD-10-CM

## 2018-10-11 DIAGNOSIS — O2340 Unspecified infection of urinary tract in pregnancy, unspecified trimester: Secondary | ICD-10-CM

## 2018-10-11 DIAGNOSIS — F32A Depression, unspecified: Secondary | ICD-10-CM

## 2018-10-11 DIAGNOSIS — F329 Major depressive disorder, single episode, unspecified: Secondary | ICD-10-CM

## 2018-10-11 DIAGNOSIS — Z3480 Encounter for supervision of other normal pregnancy, unspecified trimester: Secondary | ICD-10-CM

## 2018-10-11 DIAGNOSIS — R8271 Bacteriuria: Secondary | ICD-10-CM

## 2018-10-11 DIAGNOSIS — Z3A27 27 weeks gestation of pregnancy: Secondary | ICD-10-CM

## 2018-10-11 DIAGNOSIS — O99342 Other mental disorders complicating pregnancy, second trimester: Secondary | ICD-10-CM

## 2018-10-11 LAB — OB RESULTS CONSOLE RPR: RPR: NONREACTIVE

## 2018-10-11 LAB — OB RESULTS CONSOLE HIV ANTIBODY (ROUTINE TESTING): HIV: NONREACTIVE

## 2018-10-11 NOTE — Patient Instructions (Signed)

## 2018-10-11 NOTE — Progress Notes (Signed)
CC: Pt wants to discuss fetal kick counts and not able to sleep well at night.

## 2018-10-11 NOTE — Progress Notes (Signed)
PRENATAL VISIT NOTE  Subjective:  Tammy Moon is a 23 y.o. G2P0010 at 8228w5d being seen today for ongoing prenatal care.  She is currently monitored for the following issues for this low-risk pregnancy and has Supervision of other normal pregnancy, antepartum and UTI (urinary tract infection) during pregnancy on their problem list.  Patient reports concern on fetal kick counts and interupted sleep.  Contractions: Not present. Vag. Bleeding: None.  Movement: Present. Denies leaking of fluid.   The following portions of the patient's history were reviewed and updated as appropriate: allergies, current medications, past family history, past medical history, past social history, past surgical history and problem list.   Objective:   Vitals:   10/11/18 0843  BP: 121/84  Pulse: 79  Temp: 98.1 F (36.7 C)  Weight: 191 lb (86.6 kg)    Fetal Status: Fetal Heart Rate (bpm): 144 Fundal Height: 26 cm Movement: Present     General:  Alert, oriented and cooperative. Patient is in no acute distress.  Skin: Skin is warm and dry. No rash noted.   Cardiovascular: Normal heart rate noted  Respiratory: Normal respiratory effort, no problems with respiration noted  Abdomen: Soft, gravid, appropriate for gestational age.  Pain/Pressure: Present     Pelvic: Cervical exam deferred        Extremities: Normal range of motion.  Edema: None  Mental Status: Normal mood and affect. Normal behavior. Normal judgment and thought content.   Assessment and Plan:  Pregnancy: G2P0010 at 5228w5d 1. Supervision of other normal pregnancy, antepartum - Patient doing well, reports concern on fetal kick counts and what is normal then complains of interrupted sleep at night d/t having to urinate and being usually a stomach sleeper  - Educated and discussed FKC with patient to be started at 28 weeks, paper sheet to count added to AVS  - Encouraged patient to get body pillow to be able to sleep in other positions,  encouraged not to sleep on stomach - Discussed frequent urination is normal during pregnancy and discussed not having anything to drink immediately before trying to go to sleep, patient verbalizes understanding  - Anticipatory guidance on upcoming appointment with next being virtual, patient reports phone is broke but has plans to get it fixed by next appointment, discussed if phone is not fixed then next appointment can be in person  - Glucose Tolerance, 2 Hours w/1 Hour - CBC - RPR - HIV Antibody (routine testing w rflx)  2. Urinary tract infection in mother during pregnancy, antepartum - UTI treated on 4/18 - repeat performed d/t continued urinary frequency  - Culture, OB Urine  3. 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 Encouraged not to stress or worry about this concern at this stage of pregnancy, discussed visitor policy might be changed by the time she is due to deliver, patient given support.   Preterm labor symptoms and general obstetric precautions including but not limited to vaginal bleeding, contractions, leaking of fluid and fetal movement were reviewed in detail with the patient. Please refer to After Visit Summary  for other counseling recommendations.   Return in about 4 weeks (around 11/08/2018) for ROB-webex .  Future Appointments  Date Time Provider [place]  11/08/2018  9:30 AM Shelly Bombard, MD [place] None    Lajean Manes, CNM

## 2018-10-12 LAB — CBC
Hematocrit: 33.1 % — ABNORMAL LOW (ref 34.0–46.6)
Hemoglobin: 11.2 g/dL (ref 11.1–15.9)
MCH: 29.2 pg (ref 26.6–33.0)
MCHC: 33.8 g/dL (ref 31.5–35.7)
MCV: 86 fL (ref 79–97)
Platelets: 228 10*3/uL (ref 150–450)
RBC: 3.83 x10E6/uL (ref 3.77–5.28)
RDW: 12.3 % (ref 11.7–15.4)
WBC: 8.3 10*3/uL (ref 3.4–10.8)

## 2018-10-12 LAB — GLUCOSE TOLERANCE, 2 HOURS W/ 1HR
Glucose, 1 hour: 127 mg/dL (ref 65–179)
Glucose, 2 hour: 66 mg/dL (ref 65–152)
Glucose, Fasting: 68 mg/dL (ref 65–91)

## 2018-10-12 LAB — HIV ANTIBODY (ROUTINE TESTING W REFLEX): HIV Screen 4th Generation wRfx: NONREACTIVE

## 2018-10-12 LAB — RPR: RPR Ser Ql: NONREACTIVE

## 2018-10-15 LAB — URINE CULTURE, OB REFLEX

## 2018-10-15 LAB — CULTURE, OB URINE

## 2018-10-15 MED ORDER — CEPHALEXIN 500 MG PO CAPS: 500.0000 mg | ORAL_CAPSULE | Freq: Four times a day (QID) | ORAL | 0 refills | Status: DC

## 2018-10-15 NOTE — Addendum Note (Signed)
Addended by: Lajean Manes on: 10/15/2018 08:29 AM   Modules accepted: Orders

## 2018-11-08 ENCOUNTER — Encounter: Payer: Self-pay | Admitting: Obstetrics

## 2018-11-08 ENCOUNTER — Ambulatory Visit (INDEPENDENT_AMBULATORY_CARE_PROVIDER_SITE_OTHER): Payer: Medicaid Other | Admitting: Obstetrics

## 2018-11-08 DIAGNOSIS — M549 Dorsalgia, unspecified: Secondary | ICD-10-CM

## 2018-11-08 DIAGNOSIS — B373 Candidiasis of vulva and vagina: Secondary | ICD-10-CM | POA: Diagnosis not present

## 2018-11-08 DIAGNOSIS — F32A Depression, unspecified: Secondary | ICD-10-CM

## 2018-11-08 DIAGNOSIS — O99343 Other mental disorders complicating pregnancy, third trimester: Secondary | ICD-10-CM | POA: Diagnosis not present

## 2018-11-08 DIAGNOSIS — Z3A31 31 weeks gestation of pregnancy: Secondary | ICD-10-CM | POA: Diagnosis not present

## 2018-11-08 DIAGNOSIS — Z3480 Encounter for supervision of other normal pregnancy, unspecified trimester: Secondary | ICD-10-CM

## 2018-11-08 DIAGNOSIS — F329 Major depressive disorder, single episode, unspecified: Secondary | ICD-10-CM

## 2018-11-08 DIAGNOSIS — B3731 Acute candidiasis of vulva and vagina: Secondary | ICD-10-CM

## 2018-11-08 MED ORDER — COMFORT FIT MATERNITY SUPP SM MISC: 0 refills | Status: DC

## 2018-11-08 NOTE — Progress Notes (Signed)
Pt states she thinks she may have a yeast infection. Has change in d/c but denies itching or burning. Pt did not get Rx for UTI and will get at pharmacy and start to take. Pt states she has been having back/ hip   TELEHEALTH VIRTUAL OBSTETRICS VISIT ENCOUNTER NOTE  I connected with Tammy Moon on 11/08/18 at  9:30 AM EDT by telephone at home and verified that I am speaking with the correct person using two identifiers.   I discussed the limitations, risks, security and privacy concerns of performing an evaluation and management service by telephone and the availability of in person appointments. I also discussed with the patient that there may be a patient responsible charge related to this service. The patient expressed understanding and agreed to proceed.  Subjective:  Tammy Moon is a 23 y.o. G2P0010 at [redacted]w[redacted]d being followed for ongoing prenatal care.  She is currently monitored for the following issues for this low-risk pregnancy and has Supervision of other normal pregnancy, antepartum and UTI (urinary tract infection) during pregnancy on their problem list.  Patient reports backache, heartburn and vaginal discharge. Reports fetal movement. Denies any contractions, bleeding or leaking of fluid.   The following portions of the patient's history were reviewed and updated as appropriate: allergies, current medications, past family history, past medical history, past social history, past surgical history and problem list.   Objective:   General:  Alert, oriented and cooperative.   Mental Status: Normal mood and affect perceived. Normal judgment and thought content.  Rest of physical exam deferred due to type of encounter  Assessment and Plan:  Pregnancy: G2P0010 at [redacted]w[redacted]d 1. Supervision of other normal pregnancy, antepartum  2. Backache symptom Rx: - Elastic Bandages & Supports (COMFORT FIT MATERNITY SUPP SM) MISC; Wear as directed.  Dispense: 1 each; Refill: 0  3. Depression during  pregnancy in second trimester - clinically stable  4. Candida vaginitis - Terazol 3 Rx   Preterm labor symptoms and general obstetric precautions including but not limited to vaginal bleeding, contractions, leaking of fluid and fetal movement were reviewed in detail with the patient.  I discussed the assessment and treatment plan with the patient. The patient was provided an opportunity to ask questions and all were answered. The patient agreed with the plan and demonstrated an understanding of the instructions. The patient was advised to call back or seek an in-person office evaluation/go to [facility] at [facility] for any urgent or concerning symptoms. Please refer to After Visit Summary for other counseling recommendations.   I provided 10 minutes of non-face-to-face time during this encounter.  Return in about 2 weeks (around 11/22/2018) for MyChart.    Baltazar Najjar, MD [facility] for [place], [place] [facility] 11/08/2018  pain.

## 2018-11-22 ENCOUNTER — Encounter: Payer: Self-pay | Admitting: Obstetrics

## 2018-11-22 ENCOUNTER — Ambulatory Visit (INDEPENDENT_AMBULATORY_CARE_PROVIDER_SITE_OTHER): Payer: Medicaid Other | Admitting: Obstetrics

## 2018-11-22 ENCOUNTER — Other Ambulatory Visit: Payer: Self-pay

## 2018-11-22 DIAGNOSIS — R8271 Bacteriuria: Secondary | ICD-10-CM

## 2018-11-22 DIAGNOSIS — F329 Major depressive disorder, single episode, unspecified: Secondary | ICD-10-CM | POA: Diagnosis not present

## 2018-11-22 DIAGNOSIS — Z3480 Encounter for supervision of other normal pregnancy, unspecified trimester: Secondary | ICD-10-CM

## 2018-11-22 DIAGNOSIS — Z3A33 33 weeks gestation of pregnancy: Secondary | ICD-10-CM | POA: Diagnosis not present

## 2018-11-22 DIAGNOSIS — O99343 Other mental disorders complicating pregnancy, third trimester: Secondary | ICD-10-CM

## 2018-11-22 DIAGNOSIS — F32A Depression, unspecified: Secondary | ICD-10-CM

## 2018-11-22 DIAGNOSIS — O99820 Streptococcus B carrier state complicating pregnancy: Secondary | ICD-10-CM

## 2018-11-22 DIAGNOSIS — O99342 Other mental disorders complicating pregnancy, second trimester: Secondary | ICD-10-CM

## 2018-11-22 NOTE — Progress Notes (Signed)
I connected with  Tammy Moon on 11/22/18 by a video enabled telemedicine application and verified that I am speaking with the correct person using two identifiers.   Televisit ROB, patient is at work and is unable to check BP.  Reports no problems today.

## 2018-11-22 NOTE — Progress Notes (Signed)
   TELEHEALTH VIRTUAL OBSTETRICS VISIT ENCOUNTER NOTE  I connected with [name] on 11/22/18 at  9:45 AM EDT by telephone at home and verified that I am speaking with the correct person using two identifiers.   I discussed the limitations, risks, security and privacy concerns of performing an evaluation and management service by telephone and the availability of in person appointments. I also discussed with the patient that there may be a patient responsible charge related to this service. The patient expressed understanding and agreed to proceed.  Subjective:  Tammy Moon is a 23 y.o. G2P0010 at [redacted]w[redacted]d being followed for ongoing prenatal care.  She is currently monitored for the following issues for this high-risk pregnancy and has Supervision of other normal pregnancy, antepartum and UTI (urinary tract infection) during pregnancy on their problem list.  Patient reports no complaints. Reports fetal movement. Denies any contractions, bleeding or leaking of fluid.   The following portions of the patient's history were reviewed and updated as appropriate: allergies, current medications, past family history, past medical history, past social history, past surgical history and problem list.   Objective:   General:  Alert, oriented and cooperative.   Mental Status: Normal mood and affect perceived. Normal judgment and thought content.  Rest of physical exam deferred due to type of encounter  Assessment and Plan:  Pregnancy: G2P0010 at [redacted]w[redacted]d  1. Supervision of other normal pregnancy, antepartum  2. 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 GBS bacteriuria, treated - treat in labor   There are no diagnoses linked to this encounter. Preterm labor symptoms and general obstetric precautions including but not limited to vaginal bleeding, contractions, leaking of fluid and fetal movement were reviewed in detail with the patient.  I discussed the assessment and  treatment plan with the patient. The patient was provided an opportunity to ask questions and all were answered. The patient agreed with the plan and demonstrated an understanding of the instructions. The patient was advised to call back or seek an in-person office evaluation/go to MAU at [facility] for any urgent or concerning symptoms. Please refer to After Visit Summary for other counseling recommendations.   I provided 10 minutes of non-face-to-face time during this encounter.  Return in about 2 weeks (around 12/06/2018) for MyChart.   Baltazar Najjar, MD [facility] for [facility], [place] [facility] 11/22/2018

## 2018-12-06 ENCOUNTER — Telehealth (INDEPENDENT_AMBULATORY_CARE_PROVIDER_SITE_OTHER): Payer: Medicaid Other | Admitting: Obstetrics

## 2018-12-06 ENCOUNTER — Encounter: Payer: Self-pay | Admitting: Obstetrics

## 2018-12-06 DIAGNOSIS — Z3A35 35 weeks gestation of pregnancy: Secondary | ICD-10-CM | POA: Diagnosis not present

## 2018-12-06 DIAGNOSIS — Z3480 Encounter for supervision of other normal pregnancy, unspecified trimester: Secondary | ICD-10-CM

## 2018-12-06 DIAGNOSIS — M549 Dorsalgia, unspecified: Secondary | ICD-10-CM | POA: Diagnosis not present

## 2018-12-06 DIAGNOSIS — F329 Major depressive disorder, single episode, unspecified: Secondary | ICD-10-CM | POA: Diagnosis not present

## 2018-12-06 DIAGNOSIS — O99342 Other mental disorders complicating pregnancy, second trimester: Secondary | ICD-10-CM

## 2018-12-06 DIAGNOSIS — K219 Gastro-esophageal reflux disease without esophagitis: Secondary | ICD-10-CM

## 2018-12-06 DIAGNOSIS — F32A Depression, unspecified: Secondary | ICD-10-CM

## 2018-12-06 MED ORDER — OMEPRAZOLE 20 MG PO CPDR: 20.0000 mg | DELAYED_RELEASE_CAPSULE | Freq: Two times a day (BID) | ORAL | 5 refills | Status: DC

## 2018-12-06 NOTE — Progress Notes (Signed)
   TELEHEALTH VIRTUAL OBSTETRICS VISIT ENCOUNTER NOTE  I connected with Tammy Moon on 12/06/18 at  9:45 AM EDT by telephone at home and verified that I am speaking with the correct person using two identifiers.   I discussed the limitations, risks, security and privacy concerns of performing an evaluation and management service by telephone and the availability of in person appointments. I also discussed with the patient that there may be a patient responsible charge related to this service. The patient expressed understanding and agreed to proceed.  Subjective:  Tammy Moon is a 23 y.o. G2P0010 at [redacted]w[redacted]d being followed for ongoing prenatal care.  She is currently monitored for the following issues for this low-risk pregnancy and has Supervision of other normal pregnancy, antepartum and UTI (urinary tract infection) during pregnancy on their problem list.  Patient reports backache and heartburn. Reports fetal movement. Denies any contractions, bleeding or leaking of fluid.   The following portions of the patient's history were reviewed and updated as appropriate: allergies, current medications, past family history, past medical history, past social history, past surgical history and problem list.   Objective:   General:  Alert, oriented and cooperative.   Mental Status: Normal mood and affect perceived. Normal judgment and thought content.  Rest of physical exam deferred due to type of encounter  Assessment and Plan:  Pregnancy: G2P0010 at [redacted]w[redacted]d 1. Supervision of other normal pregnancy, antepartum  2. GERD without esophagitis Rx: - omeprazole (PRILOSEC) 20 MG capsule; Take 1 capsule (20 mg total) by mouth 2 (two) times daily before a meal.  Dispense: 60 capsule; Refill: 5  3. 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 - clinically stable  4. Backache symptom - Maternity Belt Rx   Preterm labor symptoms and general obstetric precautions including but not limited to vaginal  bleeding, contractions, leaking of fluid and fetal movement were reviewed in detail with the patient.  I discussed the assessment and treatment plan with the patient. The patient was provided an opportunity to ask questions and all were answered. The patient agreed with the plan and demonstrated an understanding of the instructions. The patient was advised to call back or seek an in-person office evaluation/go to MAU at [facility] for any urgent or concerning symptoms. Please refer to After Visit Summary for other counseling recommendations.   I provided 10 minutes of non-face-to-face time during this encounter.  Return in about 1 week (around 12/13/2018) for ROB.   Baltazar Najjar, MD Center for [facility], [place] [facility] 12/06/2018

## 2018-12-06 NOTE — Addendum Note (Signed)
Addended by: Desjuan Stearns A on: 12/06/2018 10:51 AM   Modules accepted: Level of Service  

## 2018-12-06 NOTE — Progress Notes (Signed)
S/w pt for mychart visit, pt states that her phone is not able to do the video visit, requests televisit. Pt reports fetal movement and irregular contractions. Pt does not have BP cuff with her.

## 2018-12-06 NOTE — Addendum Note (Signed)
Addended by: Alverda Nazzaro A on: 12/06/2018 10:54 AM   Modules accepted: Level of Service  

## 2018-12-20 ENCOUNTER — Other Ambulatory Visit (HOSPITAL_COMMUNITY)
Admission: RE | Admit: 2018-12-20 | Discharge: 2018-12-20 | Disposition: A | Discharge status: Home / Self Care | Payer: Medicaid Other | Source: Ambulatory Visit | Attending: Obstetrics | Admitting: Obstetrics

## 2018-12-20 ENCOUNTER — Ambulatory Visit (INDEPENDENT_AMBULATORY_CARE_PROVIDER_SITE_OTHER): Payer: Medicaid Other | Admitting: Obstetrics

## 2018-12-20 ENCOUNTER — Other Ambulatory Visit: Payer: Self-pay

## 2018-12-20 ENCOUNTER — Encounter: Payer: Self-pay | Admitting: Obstetrics

## 2018-12-20 VITALS — BP 138/81 | HR 85 | Wt 211.3 lb

## 2018-12-20 DIAGNOSIS — O99820 Streptococcus B carrier state complicating pregnancy: Secondary | ICD-10-CM

## 2018-12-20 DIAGNOSIS — Z3A37 37 weeks gestation of pregnancy: Secondary | ICD-10-CM

## 2018-12-20 DIAGNOSIS — Z3480 Encounter for supervision of other normal pregnancy, unspecified trimester: Secondary | ICD-10-CM | POA: Diagnosis not present

## 2018-12-20 DIAGNOSIS — R8271 Bacteriuria: Secondary | ICD-10-CM

## 2018-12-20 NOTE — Progress Notes (Signed)
Subjective:  Tammy Moon is a 23 y.o. G2P0010 at [redacted]w[redacted]d being seen today for ongoing prenatal care.  She is currently monitored for the following issues for this low-risk pregnancy and has Supervision of other normal pregnancy, antepartum and UTI (urinary tract infection) during pregnancy on their problem list.  Patient reports backache.  Contractions: Irritability. Vag. Bleeding: None.  Movement: Present. Denies leaking of fluid.   The following portions of the patient's history were reviewed and updated as appropriate: allergies, current medications, past family history, past medical history, past social history, past surgical history and problem list. Problem list updated.  Objective:   Vitals:   12/20/18 1045  BP: 138/81  Pulse: 85  Weight: 211 lb 4.8 oz (95.8 kg)    Fetal Status:     Movement: Present     General:  Alert, oriented and cooperative. Patient is in no acute distress.  Skin: Skin is warm and dry. No rash noted.   Cardiovascular: Normal heart rate noted  Respiratory: Normal respiratory effort, no problems with respiration noted  Abdomen: Soft, gravid, appropriate for gestational age. Pain/Pressure: Present     Pelvic:  Cervical exam deferred        Extremities: Normal range of motion.  Edema: Trace  Mental Status: Normal mood and affect. Normal behavior. Normal judgment and thought content.   Urinalysis:      Assessment and Plan:  Pregnancy: G2P0010 at [redacted]w[redacted]d  1. Supervision of other normal pregnancy, antepartum Rx: - Strep Gp B NAA - Cervicovaginal ancillary only( )  2. GBS bacteriuria - treat in labor   Term labor symptoms and general obstetric precautions including but not limited to vaginal bleeding, contractions, leaking of fluid and fetal movement were reviewed in detail with the patient. Please refer to After Visit Summary for other counseling recommendations.  No follow-ups on file.   Shelly Bombard, MD

## 2018-12-20 NOTE — Progress Notes (Signed)
Pt is here for ROB. [redacted]w[redacted]d.

## 2018-12-21 LAB — CERVICOVAGINAL ANCILLARY ONLY
Chlamydia: NEGATIVE
Neisseria Gonorrhea: NEGATIVE

## 2018-12-22 LAB — STREP GP B NAA: Strep Gp B NAA: POSITIVE — AB

## 2018-12-26 ENCOUNTER — Encounter: Payer: Self-pay | Admitting: Nurse Practitioner

## 2018-12-26 DIAGNOSIS — O99820 Streptococcus B carrier state complicating pregnancy: Secondary | ICD-10-CM | POA: Insufficient documentation

## 2018-12-27 ENCOUNTER — Telehealth (INDEPENDENT_AMBULATORY_CARE_PROVIDER_SITE_OTHER): Payer: Medicaid Other | Admitting: Nurse Practitioner

## 2018-12-27 ENCOUNTER — Encounter: Payer: Self-pay | Admitting: Nurse Practitioner

## 2018-12-27 DIAGNOSIS — Z3480 Encounter for supervision of other normal pregnancy, unspecified trimester: Secondary | ICD-10-CM

## 2018-12-27 DIAGNOSIS — Z3A38 38 weeks gestation of pregnancy: Secondary | ICD-10-CM

## 2018-12-27 DIAGNOSIS — Z3483 Encounter for supervision of other normal pregnancy, third trimester: Secondary | ICD-10-CM

## 2018-12-27 NOTE — Progress Notes (Signed)
S/w pt for mychart visit, pt reports fetal movement with few contractions, pt states she does not have BP cuff with her.

## 2018-12-27 NOTE — Patient Instructions (Signed)
Group B Streptococcus Infection During Pregnancy  Group B Streptococcus (GBS) is a type of bacteria (Streptococcus agalactiae) that is often found in healthy people, commonly in the rectum, vagina, and intestines. In people who are healthy and not pregnant, the bacteria rarely cause serious illness or complications. However, women who test positive for GBS during pregnancy can pass the bacteria to their baby during childbirth, which can cause serious infection in the baby after birth. Women with GBS may also have infections during their pregnancy or immediately after childbirth, such as urinary tract infections (UTIs) or infections of the uterus (uterine infections). Having GBS also increases a woman's risk of complications during pregnancy, such as early (preterm) labor or delivery, miscarriage, or stillbirth. Routine testing (screening) for GBS is recommended for all pregnant women. What increases the risk? You may have a higher risk for GBS infection during pregnancy if you had one during a past pregnancy. What are the signs or symptoms? In most cases, GBS infection does not cause symptoms in pregnant women. Signs and symptoms of a possible GBS-related infection may include:  Labor starting before the 37th week of pregnancy.  A UTI or bladder infection, which may cause: ? Fever. ? Pain or burning during urination. ? Frequent urination.  Fever during labor, along with: ? Bad-smelling discharge. ? Uterine tenderness. ? Rapid heartbeat in the mother, baby, or both. Rare but serious symptoms of a possible GBS-related infection in women include:  Blood infection (septicemia). This may cause fever, chills, or confusion.  Lung infection (pneumonia). This may cause fever, chills, cough, rapid breathing, difficulty breathing, or chest pain.  Bone, joint, skin, or soft tissue infection. How is this diagnosed? You may be screened for GBS between week 35 and week 37 of your pregnancy. If you have  symptoms of preterm labor, you may be screened earlier. This condition is diagnosed based on lab test results from:  A swab of fluid from the vagina and rectum.  A urine sample. How is this treated? This condition is treated with antibiotic medicine. When you go into labor, or as soon as your water breaks (your membranes rupture), you will be given antibiotics through an IV tube. Antibiotics will continue until after you give birth. If you are having a cesarean delivery, you do not need antibiotics unless your membranes have already ruptured. Follow these instructions at home:  Take over-the-counter and prescription medicines only as told by your health care provider.  Take your antibiotic medicine as told by your health care provider. Do not stop taking the antibiotic even if you start to feel better.  Keep all pre-birth (prenatal) visits and follow-up visits as told by your health care provider. This is important. Contact a health care provider if:  You have pain or burning when you urinate.  You have to urinate frequently.  You have a fever or chills.  You develop a bad-smelling vaginal discharge. Get help right away if:  Your membranes rupture.  You go into labor.  You have severe pain in your abdomen.  You have difficulty breathing.  You have chest pain. This information is not intended to replace advice given to you by your health care provider. Make sure you discuss any questions you have with your health care provider. Document Released: 06/14/2007 Document Revised: 06/28/2018 Document Reviewed: 10/01/2015 Elsevier Patient Education  2020 [employer].  Signs and Symptoms of Labor Labor is your body's natural process of moving your baby, placenta, and umbilical cord out of your uterus.  The process of labor usually starts when your baby is full-term, between 2437 and 40 weeks of pregnancy. How will I know when I am close to going into labor? As your body prepares for  labor and the birth of your baby, you may notice the following symptoms in the weeks and days before true labor starts:  Having a strong desire to get your home ready to receive your new baby. This is called nesting. Nesting may be a sign that labor is approaching, and it may occur several weeks before birth. Nesting may involve cleaning and organizing your home.  Passing a small amount of thick, bloody mucus out of your vagina (normal bloody show or losing your mucus plug). This may happen more than a week before labor begins, or it might occur right before labor begins as the opening of the cervix starts to widen (dilate). For some women, the entire mucus plug passes at once. For others, smaller portions of the mucus plug may gradually pass over several days.  Your baby moving (dropping) lower in your pelvis to get into position for birth (lightening). When this happens, you may feel more pressure on your bladder and pelvic bone and less pressure on your ribs. This may make it easier to breathe. It may also cause you to need to urinate more often and have problems with bowel movements.  Having "practice contractions" (Braxton Hicks contractions) that occur at irregular (unevenly spaced) intervals that are more than 10 minutes apart. This is also called false labor. False labor contractions are common after exercise or sexual activity, and they will stop if you change position, rest, or drink fluids. These contractions are usually mild and do not get stronger over time. They may feel like: ? A backache or back pain. ? Mild cramps, similar to menstrual cramps. ? Tightening or pressure in your abdomen. Other early symptoms that labor may be starting soon include:  Nausea or loss of appetite.  Diarrhea.  Having a sudden burst of energy, or feeling very tired.  Mood changes.  Having trouble sleeping. How will I know when labor has begun? Signs that true labor has begun may include:  Having  contractions that come at regular (evenly spaced) intervals and increase in intensity. This may feel like more intense tightening or pressure in your abdomen that moves to your back. ? Contractions may also feel like rhythmic pain in your upper thighs or back that comes and goes at regular intervals. ? For first-time mothers, this change in intensity of contractions often occurs at a more gradual pace. ? Women who have given birth before may notice a more rapid progression of contraction changes.  Having a feeling of pressure in the vaginal area.  Your water breaking (rupture of membranes). This is when the sac of fluid that surrounds your baby breaks. When this happens, you will notice fluid leaking from your vagina. This may be clear or blood-tinged. Labor usually starts within 24 hours of your water breaking, but it may take longer to begin. ? Some women notice this as a gush of fluid. ? Others notice that their underwear repeatedly becomes damp. Follow these instructions at home:   When labor starts, or if your water breaks, call your health care provider or nurse care line. Based on your situation, they will determine when you should go in for an exam.  When you are in early labor, you may be able to rest and manage symptoms at home. Some strategies to  try at home include: ? Breathing and relaxation techniques. ? Taking a warm bath or shower. ? Listening to music. ? Using a heating pad on the lower back for pain. If you are directed to use heat:  Place a towel between your skin and the heat source.  Leave the heat on for 20-30 minutes.  Remove the heat if your skin turns bright red. This is especially important if you are unable to feel pain, heat, or cold. You may have a greater risk of getting burned. Get help right away if:  You have painful, regular contractions that are 5 minutes apart or less.  Labor starts before you are [redacted] weeks along in your pregnancy.  You have a fever.   You have a headache that does not go away.  You have bright red blood coming from your vagina.  You do not feel your baby moving.  You have a sudden onset of: ? Severe headache with vision problems. ? Nausea, vomiting, or diarrhea. ? Chest pain or shortness of breath. These symptoms may be an emergency. If your health care provider recommends that you go to the hospital or birth center where you plan to deliver, do not drive yourself. Have someone else drive you, or call emergency services (911 in the [place] Summary  Labor is your body's natural process of moving your baby, placenta, and umbilical cord out of your uterus.  The process of labor usually starts when your baby is full-term, between 74 and 40 weeks of pregnancy.  When labor starts, or if your water breaks, call your health care provider or nurse care line. Based on your situation, they will determine when you should go in for an exam. This information is not intended to replace advice given to you by your health care provider. Make sure you discuss any questions you have with your health care provider. Document Released: 08/12/2016 Document Revised: 12/05/2016 Document Reviewed: 08/12/2016 Elsevier Patient Education  2020 [employer].

## 2018-12-27 NOTE — Progress Notes (Signed)
I connected with@ on 12/27/18 at  2:30 PM EDT by:  and verified that I am speaking with the correct person using two identifiers.  Patient is located at home and provider is located at [facility] office.     The purpose of this virtual visit is to provide medical care while limiting exposure to the novel coronavirus. I discussed the limitations, risks, security and privacy concerns of performing an evaluation and management service by Earlie Server, NP and the availability of in person appointments. I also discussed with the patient that there may be a patient responsible charge related to this service. By engaging in this virtual visit, you consent to the provision of healthcare.  Additionally, you authorize for your insurance to be billed for the services provided during this visit.  The patient expressed understanding and agreed to proceed.  The following staff members participated in the virtual visit: Earlie Server, NP and Elyn Peers, RN    PRENATAL VISIT NOTE  Subjective:  Tammy Moon is a 23 y.o. G2P0010 at [redacted]w[redacted]d  for phone visit for ongoing prenatal care.  She is currently monitored for the following issues for this low-risk pregnancy and has Supervision of other normal pregnancy, antepartum; UTI (urinary tract infection) during pregnancy; and Group B streptococcal carriage complicating pregnancy on their problem list.  Patient reports no complaints.  Contractions: Irregular. Vag. Bleeding: None.  Movement: Present. Denies leaking of fluid.  Has stopped working and her ankles are less swollen.  No headaches.  Drinking lots of fluids.  The following portions of the patient's history were reviewed and updated as appropriate: allergies, current medications, past family history, past medical history, past social history, past surgical history and problem list.   Objective:  There were no vitals filed for this visit. Self-Obtained  Fetal Status:     Movement: Present     Assessment and  Plan:  Pregnancy: G2P0010 at [redacted]w[redacted]d 1. Supervision of other normal pregnancy, antepartum Advised to check [facility] website from babyscripts app and take online breastfeeding and childbirth online videos. Reviewed contractions. In person visit next time for cervical exam and set up induction.  Is moving to [place] but plans to have baby at [facility]. Has a pediatrician in [place] also - advised to call them.  When leaving [facility], will go to [place]. Has packed up BP cuff and is not taking BPs (has forgotten to take them).  No info on babyscripts tab in chart. Declined  flu and TDAP vaccine.   Term labor symptoms and general obstetric precautions including but not limited to vaginal bleeding, contractions, leaking of fluid and fetal movement were reviewed in detail with the patient.  Return in about 1 week (around 01/03/2019) for in office appointment [place].  Future Appointments  Date Time Provider [place]  01/03/2019  8:45 AM Lavonia Drafts, MD [facility] None  01/10/2019 10:00 AM Lajean Manes, CNM [facility] None     Time spent on virtual visit: 12 minutes  Virginia Rochester, NP  12-27-18  3L35 pm

## 2019-01-03 ENCOUNTER — Ambulatory Visit (INDEPENDENT_AMBULATORY_CARE_PROVIDER_SITE_OTHER): Payer: Medicaid Other | Admitting: Obstetrics & Gynecology

## 2019-01-03 ENCOUNTER — Encounter: Payer: Self-pay | Admitting: Obstetrics & Gynecology

## 2019-01-03 ENCOUNTER — Other Ambulatory Visit: Payer: Self-pay

## 2019-01-03 VITALS — BP 121/88 | HR 96 | Wt 214.0 lb

## 2019-01-03 DIAGNOSIS — O2343 Unspecified infection of urinary tract in pregnancy, third trimester: Secondary | ICD-10-CM | POA: Diagnosis not present

## 2019-01-03 DIAGNOSIS — Z3A39 39 weeks gestation of pregnancy: Secondary | ICD-10-CM | POA: Diagnosis not present

## 2019-01-03 DIAGNOSIS — O99820 Streptococcus B carrier state complicating pregnancy: Secondary | ICD-10-CM

## 2019-01-03 DIAGNOSIS — O2340 Unspecified infection of urinary tract in pregnancy, unspecified trimester: Secondary | ICD-10-CM

## 2019-01-03 DIAGNOSIS — Z3480 Encounter for supervision of other normal pregnancy, unspecified trimester: Secondary | ICD-10-CM

## 2019-01-03 NOTE — Progress Notes (Signed)
ROB with complaints of pressure.  Pt request Cervix check

## 2019-01-03 NOTE — Progress Notes (Signed)
   PRENATAL VISIT NOTE  Subjective:  Tammy Moon is a 23 y.o. G2P0010 at [redacted]w[redacted]d being seen today for ongoing prenatal care.  She is currently monitored for the following issues for this low-risk pregnancy and has Supervision of other normal pregnancy, antepartum; UTI (urinary tract infection) during pregnancy; and Group B streptococcal carriage complicating pregnancy on their problem list.  Patient reports occasional contractions.  Contractions: Irritability. Vag. Bleeding: None.  Movement: Present. Denies leaking of fluid.   The following portions of the patient's history were reviewed and updated as appropriate: allergies, current medications, past family history, past medical history, past social history, past surgical history and problem list.   Objective:   Vitals:   01/03/19 0913  BP: 121/88  Pulse: 96  Weight: 214 lb (97.1 kg)    Fetal Status: Fetal Heart Rate (bpm): 144   Movement: Present     General:  Alert, oriented and cooperative. Patient is in no acute distress.  Skin: Skin is warm and dry. No rash noted.   Cardiovascular: Normal heart rate noted  Respiratory: Normal respiratory effort, no problems with respiration noted  Abdomen: Soft, gravid, appropriate for gestational age.  Pain/Pressure: Present     Pelvic: Cervical exam performed        Extremities: Normal range of motion.  Edema: None  Mental Status: Normal mood and affect. Normal behavior. Normal judgment and thought content.   Assessment and Plan:  Pregnancy: G2P0010 at 104w5d 1. Supervision of other normal pregnancy, antepartum Good FM FH WNL  Pt is 40 weeks this weekend. She is very worried because she has to move from her current address by the first of the month. Her support will not be available beyond later this month. Pt is requesting an IOL prior to 41 weeks so that she is able to move and have support at that time.  Will schedule IOL for next week       2. Group B streptococcal carriage  complicating pregnancy Needs atbx in labor  3. Urinary tract infection in mother during pregnancy, antepartum  Term labor symptoms and general obstetric precautions including but not limited to vaginal bleeding, contractions, leaking of fluid and fetal movement were reviewed in detail with the patient. Please refer to After Visit Summary for other counseling recommendations.   Return in about 1 week (around 01/10/2019).  Future Appointments  Date Time Provider [place]  01/10/2019 10:00 AM Lajean Manes, CNM [facility] None    Lavonia Drafts, MD

## 2019-01-06 ENCOUNTER — Other Ambulatory Visit: Payer: Self-pay | Admitting: Family Medicine

## 2019-01-06 ENCOUNTER — Inpatient Hospital Stay (HOSPITAL_COMMUNITY)
Admission: AD | Admit: 2019-01-06 | Discharge: 2019-01-08 | DRG: 807 | Disposition: A | Discharge status: Home / Self Care | Payer: Medicaid Other | Attending: Family Medicine | Admitting: Family Medicine

## 2019-01-06 ENCOUNTER — Encounter (HOSPITAL_COMMUNITY): Payer: Self-pay

## 2019-01-06 ENCOUNTER — Other Ambulatory Visit: Payer: Self-pay

## 2019-01-06 DIAGNOSIS — Z3A40 40 weeks gestation of pregnancy: Secondary | ICD-10-CM

## 2019-01-06 DIAGNOSIS — O164 Unspecified maternal hypertension, complicating childbirth: Secondary | ICD-10-CM | POA: Diagnosis not present

## 2019-01-06 DIAGNOSIS — O480 Post-term pregnancy: Secondary | ICD-10-CM | POA: Diagnosis present

## 2019-01-06 DIAGNOSIS — O99824 Streptococcus B carrier state complicating childbirth: Secondary | ICD-10-CM | POA: Diagnosis not present

## 2019-01-06 DIAGNOSIS — O368130 Decreased fetal movements, third trimester, not applicable or unspecified: Principal | ICD-10-CM | POA: Diagnosis present

## 2019-01-06 DIAGNOSIS — O139 Gestational [pregnancy-induced] hypertension without significant proteinuria, unspecified trimester: Secondary | ICD-10-CM

## 2019-01-06 DIAGNOSIS — Z20828 Contact with and (suspected) exposure to other viral communicable diseases: Secondary | ICD-10-CM | POA: Diagnosis present

## 2019-01-06 DIAGNOSIS — O99820 Streptococcus B carrier state complicating pregnancy: Secondary | ICD-10-CM

## 2019-01-06 DIAGNOSIS — O134 Gestational [pregnancy-induced] hypertension without significant proteinuria, complicating childbirth: Secondary | ICD-10-CM | POA: Diagnosis present

## 2019-01-06 DIAGNOSIS — Z3480 Encounter for supervision of other normal pregnancy, unspecified trimester: Secondary | ICD-10-CM

## 2019-01-06 LAB — COMPREHENSIVE METABOLIC PANEL
ALT: 9 U/L (ref 0–44)
AST: 17 U/L (ref 15–41)
Albumin: 2.5 g/dL — ABNORMAL LOW (ref 3.5–5.0)
Alkaline Phosphatase: 1406 U/L — ABNORMAL HIGH (ref 38–126)
Anion gap: 11 (ref 5–15)
BUN: <5 mg/dL — ABNORMAL LOW (ref 6–20)
CO2: 19 mmol/L — ABNORMAL LOW (ref 22–32)
Calcium: 9 mg/dL (ref 8.9–10.3)
Chloride: 106 mmol/L (ref 98–111)
Creatinine, Ser: 0.77 mg/dL (ref 0.44–1.00)
GFR calc Af Amer: >60 mL/min (ref >60)
GFR calc non Af Amer: >60 mL/min (ref >60)
Glucose, Bld: 98 mg/dL (ref 70–99)
Potassium: 3.8 mmol/L (ref 3.5–5.1)
Sodium: 136 mmol/L (ref 135–145)
Total Bilirubin: 0.7 mg/dL (ref 0.3–1.2)
Total Protein: 5.9 g/dL — ABNORMAL LOW (ref 6.5–8.1)

## 2019-01-06 LAB — TYPE AND SCREEN
ABO/RH(D): O POS
Antibody Screen: NEGATIVE

## 2019-01-06 LAB — SARS CORONAVIRUS 2 BY RT PCR (HOSPITAL ORDER, PERFORMED IN ~~LOC~~ HOSPITAL LAB): SARS Coronavirus 2: NEGATIVE

## 2019-01-06 LAB — PROTEIN / CREATININE RATIO, URINE
Creatinine, Urine: 24.23 mg/dL
Total Protein, Urine: <6 mg/dL

## 2019-01-06 LAB — CBC
HCT: 34.1 % — ABNORMAL LOW (ref 36.0–46.0)
Hemoglobin: 11.8 g/dL — ABNORMAL LOW (ref 12.0–15.0)
MCH: 29.6 pg (ref 26.0–34.0)
MCHC: 34.6 g/dL (ref 30.0–36.0)
MCV: 85.7 fL (ref 80.0–100.0)
Platelets: 150 10*3/uL (ref 150–400)
RBC: 3.98 MIL/uL (ref 3.87–5.11)
RDW: 12.7 % (ref 11.5–15.5)
WBC: 9.1 10*3/uL (ref 4.0–10.5)
nRBC: 0 % (ref 0.0–0.2)

## 2019-01-06 LAB — ABO/RH: ABO/RH(D): O POS

## 2019-01-06 LAB — URINALYSIS, ROUTINE W REFLEX MICROSCOPIC
Bilirubin Urine: NEGATIVE
Glucose, UA: NEGATIVE mg/dL
Hgb urine dipstick: NEGATIVE
Ketones, ur: NEGATIVE mg/dL
Leukocytes,Ua: NEGATIVE
Nitrite: NEGATIVE
Protein, ur: NEGATIVE mg/dL
Specific Gravity, Urine: 1.002 — ABNORMAL LOW (ref 1.005–1.030)
pH: 6 (ref 5.0–8.0)

## 2019-01-06 MED ORDER — OXYTOCIN BOLUS FROM INFUSION
500.0000 mL | Freq: Once | INTRAVENOUS | Status: AC
  Administered 2019-01-07: 500 mL via INTRAVENOUS

## 2019-01-06 MED ORDER — LIDOCAINE HCL (PF) 1 % IJ SOLN: 30.0000 mL | INTRAMUSCULAR | Status: DC | PRN

## 2019-01-06 MED ORDER — LACTATED RINGERS IV SOLN: 500.0000 mL | INTRAVENOUS | Status: DC | PRN

## 2019-01-06 MED ORDER — ONDANSETRON HCL 4 MG/2ML IJ SOLN
4.0000 mg | Freq: Four times a day (QID) | INTRAMUSCULAR | Status: DC | PRN
  Administered 2019-01-07 (×2): 4 mg via INTRAVENOUS
  Filled 2019-01-06 (×2): qty 2

## 2019-01-06 MED ORDER — SODIUM CHLORIDE 0.9 % IV SOLN
5.0000 10*6.[IU] | Freq: Once | INTRAVENOUS | Status: AC
  Administered 2019-01-06: 5 10*6.[IU] via INTRAVENOUS
  Filled 2019-01-06: qty 5

## 2019-01-06 MED ORDER — OXYTOCIN 40 UNITS IN NORMAL SALINE INFUSION - SIMPLE MED
2.5000 [IU]/h | INTRAVENOUS | Status: DC
  Filled 2019-01-06: qty 1000

## 2019-01-06 MED ORDER — ACETAMINOPHEN 325 MG PO TABS: 650.0000 mg | ORAL_TABLET | ORAL | Status: DC | PRN

## 2019-01-06 MED ORDER — SOD CITRATE-CITRIC ACID 500-334 MG/5ML PO SOLN: 30.0000 mL | ORAL | Status: DC | PRN

## 2019-01-06 MED ORDER — PENICILLIN G 3 MILLION UNITS IVPB - SIMPLE MED
3.0000 10*6.[IU] | INTRAVENOUS | Status: DC
  Administered 2019-01-06 – 2019-01-07 (×4): 3 10*6.[IU] via INTRAVENOUS
  Filled 2019-01-06 (×4): qty 100

## 2019-01-06 MED ORDER — MISOPROSTOL 25 MCG QUARTER TABLET
25.0000 ug | ORAL_TABLET | ORAL | Status: DC | PRN
  Administered 2019-01-06 (×2): 25 ug via VAGINAL
  Filled 2019-01-06 (×2): qty 1

## 2019-01-06 MED ORDER — LACTATED RINGERS IV SOLN
INTRAVENOUS | Status: DC
  Administered 2019-01-06 (×2): via INTRAVENOUS

## 2019-01-06 MED ORDER — FENTANYL CITRATE (PF) 100 MCG/2ML IJ SOLN
100.0000 ug | INTRAMUSCULAR | Status: DC | PRN
  Administered 2019-01-07 (×3): 100 ug via INTRAVENOUS
  Filled 2019-01-06 (×3): qty 2

## 2019-01-06 MED ORDER — TERBUTALINE SULFATE 1 MG/ML IJ SOLN: 0.2500 mg | Freq: Once | INTRAMUSCULAR | Status: DC | PRN

## 2019-01-06 NOTE — H&P (Addendum)
Tammy Moon is a 23 y.o. female presenting for abdominal pain and decreased fetal movement. She endorses "very painful" contractions q 10 minutes. She also endorses pain "right in my vagina" that is sharp and subsides with rest. She has not taken medication for these complaints.  Patient also states she has not felt her baby move since 0530 this morning. She is unaware of interventions to trigger fetal movement. She denies vaginal bleeding, leaking of fluid, fever, falls, or recent illness.    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 Asthma    as a child    Past Surgical History:  Procedure Laterality Date  . THERAPEUTIC ABORTION  2015   Family History: family history includes Diabetes in her father, maternal grandfather, maternal grandmother, paternal grandfather, and paternal grandmother; HIV in her mother. Social History:  reports that she has never smoked. She has never used smokeless tobacco. She reports previous alcohol use. She reports current drug use. Drug: Marijuana.     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  Eyes: Negative for blurred vision.  Respiratory: Negative for shortness of breath.   Gastrointestinal: Positive  for abdominal pain.  Neurological: Negative for headaches.  All other systems reviewed and are negative.    Blood pressure 115/70, pulse 92, resp. rate 16, height 5\' 10"  (1.778 m), weight 98.2 kg, last menstrual period 03/31/2018, SpO2 98 %. Physical Exam  Nursing note and vitals reviewed. Constitutional: She is oriented to person, place, and time. She appears well-developed and well-nourished.  Cardiovascular: Normal rate.  Respiratory: Effort normal and breath sounds normal.  GI:  Gravid  Genitourinary:    Vagina and uterus normal.   Neurological: She is alert and oriented to person, place, and time.  Skin: Skin is warm and dry.  Psychiatric: She has a normal mood and affect. Her behavior is normal. Judgment and thought content normal.    Prenatal labs: ABO, Rh: O/Positive/-- (04/23 0953) Antibody: Negative (04/23 0953) Rubella: 5.82 (04/23 0953) RPR: Non Reactive (07/23 0914)  HBsAg: Negative (04/23 0953)  HIV: Non Reactive (07/23 0914)  GBS: --Tessie Fass (10/01 1147)   Fetal Tracing: --Baseline 140, moderate variability, positive accels no decels --Toco: occasional mild contractions, up to 7 minutes between contractions  Assessment/Plan: --23 y.o. G2P0010 at [redacted]w[redacted]d   --Category I tracing --DFM, unable to confirm fetal movement at any time during evaluation in MAU --New onset elevated blood pressure over past three hours, normal PEC labs, no severe signs/sx --SVE: 1.5cm, unchanged from clinic --GBS + (  Bacteruria) --Desires epidural in active labor. FOB Apolinar Junes at bedside --girl/breast/POPs --Report given to Jerilynn Birkenhead, MD  Calvert Cantor, CNM 01/06/2019, 1:54 PM

## 2019-01-06 NOTE — Progress Notes (Signed)
Labor Progress Note Tammy Moon is a 23 y.o. G2P0010 at [redacted]w[redacted]d presented for new onset HTN at term  S:  Patient comfortable and eating. Denies any HA, visual changes or epigastric pain.  O:  BP (!) 147/95   Pulse 91   Temp 97.8 F (36.6 C) (Axillary)   Resp 16   Ht 5\' 10"  (1.778 m)   Wt 98 kg   LMP 03/31/2018   SpO2 98%   BMI 30.99 kg/m   Fetal Tracing:  Baseline: 150 Variability: moderate Accels: 15x15 Decels: none  Toco: 2-4   CVE: Dilation: 1 Effacement (%): 50 Cervical Position: Posterior Station: -2 Presentation: Vertex Exam by:: Remus Blake RN   A&P: 23 y.o. G2P0010 [redacted]w[redacted]d IOL new onset HTN, labs normal #Labor: Discussed with patient placement of foley balloon for cervical ripening. Risks and benefits reviewed. Patient agreeable to plan of care. Will attempt FB placement with next check. #Pain: planning epidural  #FWB: Cat 1 #GBS positive   Wende Mott, CNM 8:43 PM

## 2019-01-06 NOTE — MAU Note (Signed)
Tammy Moon is a 23 y.o. at [redacted]w[redacted]d here in MAU reporting: lost her mucus plug came out yesterday. Having contractions now, about every 10 min, "they are not severe". Having some pain around her vagina in the muscles since about 0500 today. No LOF, no bleeding. Felt good FM yesterday but since today when she started having that pain she has not felt movement. Was 1.5 cm on Thursday.   Onset of complaint: got worse today  Pain score: contractions 5/10, vagina 7/10 (moderate)  Vitals:   01/06/19 1114  BP: (!) 143/86  Pulse: (!) 102  Resp: 16  SpO2: 99%     FHT: 145  Lab orders placed from triage: none

## 2019-01-07 ENCOUNTER — Inpatient Hospital Stay (HOSPITAL_COMMUNITY): Payer: Medicaid Other | Admitting: Anesthesiology

## 2019-01-07 ENCOUNTER — Encounter (HOSPITAL_COMMUNITY): Payer: Self-pay

## 2019-01-07 DIAGNOSIS — Z3A40 40 weeks gestation of pregnancy: Secondary | ICD-10-CM

## 2019-01-07 DIAGNOSIS — O134 Gestational [pregnancy-induced] hypertension without significant proteinuria, complicating childbirth: Secondary | ICD-10-CM

## 2019-01-07 DIAGNOSIS — O480 Post-term pregnancy: Secondary | ICD-10-CM

## 2019-01-07 DIAGNOSIS — O99824 Streptococcus B carrier state complicating childbirth: Secondary | ICD-10-CM

## 2019-01-07 DIAGNOSIS — O368130 Decreased fetal movements, third trimester, not applicable or unspecified: Secondary | ICD-10-CM

## 2019-01-07 LAB — CBC
HCT: 36.8 % (ref 36.0–46.0)
HCT: 34 % — ABNORMAL LOW (ref 36.0–46.0)
Hemoglobin: 12.1 g/dL (ref 12.0–15.0)
Hemoglobin: 11.9 g/dL — ABNORMAL LOW (ref 12.0–15.0)
MCH: 28.8 pg (ref 26.0–34.0)
MCH: 30 pg (ref 26.0–34.0)
MCHC: 32.9 g/dL (ref 30.0–36.0)
MCHC: 35 g/dL (ref 30.0–36.0)
MCV: 87.6 fL (ref 80.0–100.0)
MCV: 85.6 fL (ref 80.0–100.0)
Platelets: 150 10*3/uL (ref 150–400)
Platelets: 144 10*3/uL — ABNORMAL LOW (ref 150–400)
RBC: 4.2 MIL/uL (ref 3.87–5.11)
RBC: 3.97 MIL/uL (ref 3.87–5.11)
RDW: 12.7 % (ref 11.5–15.5)
RDW: 12.7 % (ref 11.5–15.5)
WBC: 11.2 10*3/uL — ABNORMAL HIGH (ref 4.0–10.5)
WBC: 13 10*3/uL — ABNORMAL HIGH (ref 4.0–10.5)
nRBC: 0 % (ref 0.0–0.2)
nRBC: 0 % (ref 0.0–0.2)

## 2019-01-07 LAB — RPR: RPR Ser Ql: NONREACTIVE

## 2019-01-07 MED ORDER — BENZOCAINE-MENTHOL 20-0.5 % EX AERO
1.0000 "application " | INHALATION_SPRAY | CUTANEOUS | Status: DC | PRN
  Administered 2019-01-07: 1 via TOPICAL
  Filled 2019-01-07: qty 56

## 2019-01-07 MED ORDER — ACETAMINOPHEN 325 MG PO TABS: 650.0000 mg | ORAL_TABLET | ORAL | Status: DC | PRN

## 2019-01-07 MED ORDER — SENNOSIDES-DOCUSATE SODIUM 8.6-50 MG PO TABS
2.0000 | ORAL_TABLET | ORAL | Status: DC
  Administered 2019-01-07: 23:00:00 2 via ORAL
  Filled 2019-01-07: qty 2

## 2019-01-07 MED ORDER — ONDANSETRON HCL 4 MG PO TABS: 4.0000 mg | ORAL_TABLET | ORAL | Status: DC | PRN

## 2019-01-07 MED ORDER — FENTANYL-BUPIVACAINE-NACL 0.5-0.125-0.9 MG/250ML-% EP SOLN
12.0000 mL/h | EPIDURAL | Status: DC | PRN
  Filled 2019-01-07: qty 250

## 2019-01-07 MED ORDER — EPHEDRINE 5 MG/ML INJ: 10.0000 mg | INTRAVENOUS | Status: DC | PRN

## 2019-01-07 MED ORDER — MISOPROSTOL 50MCG HALF TABLET
50.0000 ug | ORAL_TABLET | ORAL | Status: DC
  Administered 2019-01-07: 50 ug via BUCCAL
  Filled 2019-01-07: qty 1

## 2019-01-07 MED ORDER — DIPHENHYDRAMINE HCL 50 MG/ML IJ SOLN: 12.5000 mg | INTRAMUSCULAR | Status: DC | PRN

## 2019-01-07 MED ORDER — TERBUTALINE SULFATE 1 MG/ML IJ SOLN: 0.2500 mg | Freq: Once | INTRAMUSCULAR | Status: DC | PRN

## 2019-01-07 MED ORDER — ONDANSETRON HCL 4 MG/2ML IJ SOLN: 4.0000 mg | INTRAMUSCULAR | Status: DC | PRN

## 2019-01-07 MED ORDER — OXYTOCIN 40 UNITS IN NORMAL SALINE INFUSION - SIMPLE MED
1.0000 m[IU]/min | INTRAVENOUS | Status: DC
  Administered 2019-01-07: 07:00:00 2 m[IU]/min via INTRAVENOUS

## 2019-01-07 MED ORDER — DIBUCAINE (PERIANAL) 1 % EX OINT: 1.0000 "application " | TOPICAL_OINTMENT | CUTANEOUS | Status: DC | PRN

## 2019-01-07 MED ORDER — ZOLPIDEM TARTRATE 5 MG PO TABS: 5.0000 mg | ORAL_TABLET | Freq: Every evening | ORAL | Status: DC | PRN

## 2019-01-07 MED ORDER — SIMETHICONE 80 MG PO CHEW: 80.0000 mg | CHEWABLE_TABLET | ORAL | Status: DC | PRN

## 2019-01-07 MED ORDER — DIPHENHYDRAMINE HCL 25 MG PO CAPS: 25.0000 mg | ORAL_CAPSULE | Freq: Four times a day (QID) | ORAL | Status: DC | PRN

## 2019-01-07 MED ORDER — LACTATED RINGERS IV SOLN
500.0000 mL | Freq: Once | INTRAVENOUS | Status: AC
  Administered 2019-01-07: 06:00:00 500 mL via INTRAVENOUS

## 2019-01-07 MED ORDER — TETANUS-DIPHTH-ACELL PERTUSSIS 5-2.5-18.5 LF-MCG/0.5 IM SUSP: 0.5000 mL | Freq: Once | INTRAMUSCULAR | Status: DC

## 2019-01-07 MED ORDER — COCONUT OIL OIL: 1.0000 "application " | TOPICAL_OIL | Status: DC | PRN

## 2019-01-07 MED ORDER — PHENYLEPHRINE 40 MCG/ML (10ML) SYRINGE FOR IV PUSH (FOR BLOOD PRESSURE SUPPORT): 80.0000 ug | PREFILLED_SYRINGE | INTRAVENOUS | Status: DC | PRN

## 2019-01-07 MED ORDER — LIDOCAINE HCL (PF) 1 % IJ SOLN
INTRAMUSCULAR | Status: DC | PRN
  Administered 2019-01-07 (×2): 4 mL via EPIDURAL

## 2019-01-07 MED ORDER — WITCH HAZEL-GLYCERIN EX PADS: 1.0000 "application " | MEDICATED_PAD | CUTANEOUS | Status: DC | PRN

## 2019-01-07 MED ORDER — PRENATAL MULTIVITAMIN CH
1.0000 | ORAL_TABLET | Freq: Every day | ORAL | Status: DC
  Administered 2019-01-08: 1 via ORAL
  Filled 2019-01-07: qty 1

## 2019-01-07 MED ORDER — IBUPROFEN 600 MG PO TABS
600.0000 mg | ORAL_TABLET | Freq: Four times a day (QID) | ORAL | Status: DC
  Administered 2019-01-07 – 2019-01-08 (×3): 600 mg via ORAL
  Filled 2019-01-07 (×4): qty 1

## 2019-01-07 MED ORDER — SODIUM CHLORIDE (PF) 0.9 % IJ SOLN
INTRAMUSCULAR | Status: DC | PRN
  Administered 2019-01-07: 12 mL/h via EPIDURAL

## 2019-01-07 NOTE — Anesthesia Preprocedure Evaluation (Signed)
Anesthesia Evaluation  Patient identified by MRN, date of birth, ID band Patient awake    Reviewed: Allergy & Precautions, NPO status , Patient's Chart, lab work & pertinent test results  Airway Mallampati: II  TM Distance: >3 FB Neck ROM: Full    Dental  (+) Dental Advisory Given   Pulmonary asthma ,    Pulmonary exam normal breath sounds clear to auscultation       Cardiovascular hypertension, Normal cardiovascular exam Rhythm:Regular Rate:Normal     Neuro/Psych negative neurological ROS  negative psych ROS   GI/Hepatic negative GI ROS, Neg liver ROS,   Endo/Other  negative endocrine ROS  Renal/GU negative Renal ROS     Musculoskeletal negative musculoskeletal ROS (+)   Abdominal   Peds  Hematology negative hematology ROS (+)   Anesthesia Other Findings   Reproductive/Obstetrics (+) Pregnancy                             Anesthesia Physical Anesthesia Plan  ASA: II  Anesthesia Plan: Epidural   Post-op Pain Management:    Induction:   PONV Risk Score and Plan:   Airway Management Planned:   Additional Equipment:   Intra-op Plan:   Post-operative Plan:   Informed Consent: I have reviewed the patients History and Physical, chart, labs and discussed the procedure including the risks, benefits and alternatives for the proposed anesthesia with the patient or authorized representative who has indicated his/her understanding and acceptance.       Plan Discussed with:   Anesthesia Plan Comments:         Anesthesia Quick Evaluation

## 2019-01-07 NOTE — Progress Notes (Signed)
LABOR PROGRESS NOTE  Tammy Moon is a 23 y.o. G2P0010 at [redacted]w[redacted]d  admitted for SOL with decreased fetal movement.  Subjective: Patient is feeling a lot of pressure in her vagina and reports she feels like she has to have a bowel movement.  Is otherwise doing well.  Objective: BP 124/64   Pulse 92   Temp 98.1 F (36.7 C) (Axillary)   Resp 16   Ht 5\' 10"  (1.778 m)   Wt 98 kg   LMP 03/31/2018   SpO2 97%   BMI 30.99 kg/m  or  Vitals:   01/07/19 0946 01/07/19 1000 01/07/19 1014 01/07/19 1100  BP:  127/77  124/64  Pulse:  92  92  Resp: 16  16   Temp: 98.1 F (36.7 C)     TempSrc: Axillary     SpO2: 98%  97% 97%  Weight:      Height:         Dilation: Lip/rim Effacement (%): 100 Cervical Position: Middle Station: Plus 1 Presentation: Vertex Exam by:: Dr. Caron Presume FHT: baseline rate 130, moderate varibility, + acel, no decel Toco: 2-4 min   Labs: Lab Results  Component Value Date   WBC 11.2 (H) 01/07/2019   HGB 12.1 01/07/2019   HCT 36.8 01/07/2019   MCV 87.6 01/07/2019   PLT 150 01/07/2019    Patient Active Problem List   Diagnosis Date Noted  . Labor and delivery, indication for care 01/06/2019  . Gestational hypertension 01/06/2019  . [redacted] weeks gestation of pregnancy 01/06/2019  . Group B streptococcal carriage complicating pregnancy 77/82/4235  . Supervision of other normal pregnancy, antepartum 07/12/2018  . UTI (urinary tract infection) during pregnancy 07/12/2018    Assessment / Plan: 23 y.o. G2P0010 at [redacted]w[redacted]d here for SOL with decreased fetal movement   Labor: Patient is progressing through labor.  AROM with meconium fluid at approximately 1115.  Patient is almost complete with small anterior lip.  Will level labor down and reassess in 1 hour Fetal Wellbeing: Category 1, reassuring Pain Control: Epidural in place Anticipated MOD: Vaginal  Gifford Shave, MD  PGY-1, [facility]  01/07/2019, 11:31 AM

## 2019-01-07 NOTE — Anesthesia Procedure Notes (Signed)
Epidural Patient location during procedure: OB  Staffing Anesthesiologist: Nyara Capell, MD Performed: anesthesiologist   Preanesthetic Checklist Completed: patient identified, pre-op evaluation, timeout performed, IV checked, risks and benefits discussed and monitors and equipment checked  Epidural Patient position: sitting Prep: site prepped and draped and DuraPrep Patient monitoring: heart rate, continuous pulse ox and blood pressure Approach: midline Location: L2-L3 Injection technique: LOR air and LOR saline  Needle:  Needle type: Tuohy  Needle gauge: 17 G Needle length: 9 cm Needle insertion depth: 6 cm Catheter type: closed end flexible Catheter size: 19 Gauge Catheter at skin depth: 11 cm Test dose: negative  Assessment Sensory level: T8 Events: blood not aspirated, injection not painful, no injection resistance, negative IV test and no paresthesia  Additional Notes Reason for block:procedure for pain     

## 2019-01-07 NOTE — Discharge Summary (Addendum)
Postpartum Discharge Summary     Patient Name: Tammy Moon DOB: 13-Dec-1995 MRN: [id number]  Date of admission: 01/06/2019 Delivering Provider: Chauncey Mann   Date of discharge: 01/08/2019  Admitting diagnosis: Pelvic pain, pressure, and CTX Intrauterine pregnancy: [redacted]w[redacted]d    Secondary diagnosis:  Active Problems:   Labor and delivery, indication for care   Gestational hypertension   [redacted] weeks gestation of pregnancy  Additional problems: None     Discharge diagnosis: Term Pregnancy Delivered and Gestational Hypertension                                                                                                Post partum procedures:None  Augmentation: AROM, Pitocin and Cytotec  Complications: None  Hospital course:  Induction of Labor With Vaginal Delivery   23y.o. yo G2P0010 at 451w2das admitted to the hospital 01/06/2019 for induction of labor.  Indication for induction: Postdates and Gestational hypertension.  Patient had an uncomplicated labor course. She presented with elevated blood pressure to the 148/91. Labs were normal. BP resolved with delivery and patient asymptomatic. She presented at 1.5 cm and progressed to complete with augmentation with cytotec and pitocin.     Membrane Rupture Time/Date: 11:09 AM ,01/07/2019   Intrapartum Procedures: Episiotomy: None [1]                                         Lacerations:  1st degree [2]  Patient had delivery of a Viable infant.  Information for the patient's newborn:  McJonee, Lamoreirl AlDenis0[[id number]]Delivery Method: Vaginal, Spontaneous(Filed from Delivery Summary)    01/07/2019   Details of delivery can be found in separate delivery note. She is planning POPs for birth control. BP's postpartum have been appropriate. Patient had a routine postpartum course. Patient is discharged home 01/08/19. Delivery time: 12:26 PM    Magnesium Sulfate received: No BMZ received:  No Rhophylac:No MMR:No Transfusion:No  Physical exam  Vitals:   01/07/19 1602 01/07/19 2009 01/08/19 0000 01/08/19 0538  BP: (!) 146/80 (!) 148/80 137/74 122/70  Pulse: 73 81 88 89  Resp: 18 18 18 18   Temp:  97.9 F (36.6 C) 98 F (36.7 C) 98.2 F (36.8 C)  TempSrc:  Oral Oral Oral  SpO2:  97% 98% 100%  Weight:      Height:       General: alert Lochia: appropriate Uterine Fundus: firm Incision: N/A DVT Evaluation: No evidence of DVT seen on physical exam. Labs: Lab Results  Component Value Date   WBC 16.1 (H) 01/08/2019   HGB 12.0 01/08/2019   HCT 34.7 (L) 01/08/2019   MCV 86.1 01/08/2019   PLT 147 (L) 01/08/2019   CMP Latest Ref Rng & Units 01/06/2019  Glucose 70 - 99 mg/dL 98  BUN 6 - 20 mg/dL <5(L)  Creatinine 0.44 - 1.00 mg/dL 0.77  Sodium 135 - 145 mmol/L 136  Potassium 3.5 - 5.1 mmol/L 3.8  Chloride 98 - 111 mmol/L 106  CO2 22 - 32 mmol/L 19(L)  Calcium 8.9 - 10.3 mg/dL 9.0  Total Protein 6.5 - 8.1 g/dL 5.9(L)  Total Bilirubin 0.3 - 1.2 mg/dL 0.7  Alkaline Phos 38 - 126 U/L 1,406(H)  AST 15 - 41 U/L 17  ALT 0 - 44 U/L 9    Discharge instruction: per After Visit Summary and "Baby and Me Booklet".  After visit meds:  Allergies as of 01/08/2019      Reactions   Latex Itching      Medication List    STOP taking these medications   acetaminophen 500 MG tablet Commonly known as: TYLENOL   calcium carbonate 500 MG chewable tablet Commonly known as: TUMS - dosed in mg elemental calcium   cephALEXin 500 MG capsule Commonly known as: [place] Supp Sm Misc   metroNIDAZOLE 500 MG tablet Commonly known as: FLAGYL   nitrofurantoin (macrocrystal-monohydrate) 100 MG capsule Commonly known as: MACROBID   omeprazole 20 MG capsule Commonly known as: PriLOSEC   sertraline 25 MG tablet Commonly known as: Zoloft     TAKE these medications   ibuprofen 800 MG tablet Commonly known as: ADVIL Take 1 tablet (800 mg total) by  mouth every 8 (eight) hours as needed.   multivitamin-prenatal 27-0.8 MG Tabs tablet Take 1 tablet by mouth daily at 12 noon.       Diet: routine diet  Activity: Advance as tolerated. Pelvic rest for 6 weeks.   Outpatient follow up:4 weeks Follow up Appt: Future Appointments  Date Time Provider [place]  02/08/2019  9:00 AM Luvenia Redden, PA-C [facility] None   Follow up Visit:   Please schedule this patient for Postpartum visit in: 4 weeks with the following provider: Any provider Low risk pregnancy complicated by: HTN Delivery mode:  SVD Anticipated Birth Control:  POPs PP Procedures needed: BP check  Schedule Integrated BH visit: no      Newborn Data: Live born female  Birth Weight: 3776 APGAR: 26, 9  Newborn Delivery   Birth date/time: 01/07/2019 12:26:00 Delivery type: Vaginal, Spontaneous      Baby Feeding: Breast Disposition:home with mother   01/08/2019 Anjel Frock, MD  I confirm that I have verified and agree with the information documented in the resident's note.   Marcille Buffy DNP, CNM  01/08/19  11:30 AM

## 2019-01-07 NOTE — Progress Notes (Signed)
Labor Progress Note Tammy Moon is a 23 y.o. G2P0010 at [redacted]w[redacted]d presented for new onset HTN at term  S:  Patient reports worse contractions causing nausea  O:  BP (!) 148/91   Pulse 79   Temp 97.8 F (36.6 C) (Oral)   Resp 16   Ht 5\' 10"  (1.778 m)   Wt 98 kg   LMP 03/31/2018   SpO2 98%   BMI 30.99 kg/m   Fetal Tracing:  Baseline: 125 Variability: moderate Accels: 15x15 Decels: none  Toco: 1-5   CVE: 3/100/0   A&P: 23 y.o. G2P0010 110w2d IOL for new HTN at term #Labor: Progressing well. Will start pitocin 2x2 after patient gets her epidural #Pain: epidural #FWB: Cat 1 #GBS positive   Wende Mott, CNM 5:10 AM

## 2019-01-08 ENCOUNTER — Inpatient Hospital Stay (HOSPITAL_COMMUNITY): Admission: AD | Admit: 2019-01-08 | Payer: Medicaid Other | Source: Home / Self Care | Admitting: Family Medicine

## 2019-01-08 ENCOUNTER — Inpatient Hospital Stay (HOSPITAL_COMMUNITY): Payer: Medicaid Other

## 2019-01-08 LAB — CBC
HCT: 34.7 % — ABNORMAL LOW (ref 36.0–46.0)
Hemoglobin: 12 g/dL (ref 12.0–15.0)
MCH: 29.8 pg (ref 26.0–34.0)
MCHC: 34.6 g/dL (ref 30.0–36.0)
MCV: 86.1 fL (ref 80.0–100.0)
Platelets: 147 10*3/uL — ABNORMAL LOW (ref 150–400)
RBC: 4.03 MIL/uL (ref 3.87–5.11)
RDW: 12.8 % (ref 11.5–15.5)
WBC: 16.1 10*3/uL — ABNORMAL HIGH (ref 4.0–10.5)
nRBC: 0 % (ref 0.0–0.2)

## 2019-01-08 MED ORDER — IBUPROFEN 800 MG PO TABS: 800.0000 mg | ORAL_TABLET | Freq: Three times a day (TID) | ORAL | 0 refills | Status: AC | PRN

## 2019-01-08 NOTE — Clinical Social Work Maternal (Signed)
CLINICAL SOCIAL WORK MATERNAL/CHILD NOTE  Patient Details  Name: Tammy Moon MRN: [id number] Date of Birth: 12-15-1995  Date:  01/08/2019  Clinical Social Worker Initiating Note:  Durward Fortes, LCSW Date/Time: Initiated:  01/08/19/0900     Child's Name:  Ernestine Mcmurray   Biological Parents:  Mother   Need for Interpreter:  None   Reason for Referral:  Behavioral Health Concerns, Current Substance Use/Substance Use During Pregnancy    Address:  [address] [place] [address]    Phone number:  [phone] (home)     Additional phone number: none   Household Members/Support Persons (HM/SP):   Household Member/Support Person 1   HM/SP Name Relationship DOB or Age  HM/SP -1 Tammy Moon  MOB   23  HM/SP -2        HM/SP -3        HM/SP -4        HM/SP -5        HM/SP -6        HM/SP -7        HM/SP -8          Natural Supports (not living in the home):  Parent   Professional Supports: None   Employment: Full-time   Type of Work: on Administrator leave   Education:  Forensic psychologist   Homebound arranged:  none   Financial Resources:  Medicaid   Other Resources:  Physicist, medical , [place] Considerations Which May Impact Care:  none reported.   Strengths:  Ability to meet basic needs , Compliance with medical plan , Home prepared for child    Psychotropic Medications:    was prescribed a medication for anxiety/depression but reported that she is not taking it.      Pediatrician:     not yet chosen   Pediatrician List:   [facility]      Pediatrician Fax Number:    Risk Factors/Current Problems:  None   Cognitive State:  Alert , Able to Concentrate , Insightful    Mood/Affect:  Calm , Comfortable , Relaxed    CSW Assessment: CSW consulted as MOB had reported THC use during pregnancy. CSW also aware that MOB had anxiety and  depression during this pregnancy. CSW went to speak with MOB at bedside. Upon entering the room CSW congratulated MOB on the birth of infant Trixie Rude). CSW observed that MOB was on the phone. CSW offered to return at a later time, however MOB reported that it was only her mother on the phone and that it was okay for CSW to speak with her while mother was on the phone. CSW confirmed with MOB that it was okay for CSW to discuss anything with MOB while mother was on the phone and MOB reported "yes-she's my mom". CSW understanding and advised MOB of the reason for the visit as well as CSW's role. MOB reported that she was diagnosed with anxiety and depression at the start of pregnancy. MOB reported that she was having a hard time with pregnancy at the beginning. MOB reported that she was prescribed medication however she chose to not take it as she reports that anxiety and depression became fine. MOB reports that she has been feeling fine throughout her pregnancy. MOB currently denies SI or HI and reports that she is not in a  domestic violence relationship.  CSW inquired from [facility] on her substance use while pregnant. MOB reports that she used THC earlier in her pregnancy but stopped then. MOB unsure of last date of use however reproted that it wasn't recently. MOB reports "nope I am drug free". CSW understanding but also proceeded with informing MOB of the hospital drug screen policy. At this time moment FOB walked into the  room. CSW continued to speak with MOB by informing her that there should be cotton balls in infants diaper. FOB reports that after they gave infant a bath last night that cotton balls were never placed back in infants pamper. CSW understanding and advised them that if UDS is unable to be colleted then  CDS would still remain in place. MOB reported understanding and again informed CSW that she did not take any medications during her pregnancy.   CSW made aware that MOB support at this time is her  mom. MOB reports that she is super excited to have infant and reports that she has all needed items at this time to care for infant. MOB expressed no other needs at this time.    CSW will continue to follow CDS for CPS report if warranted.   CSW Plan/Description:  No Further Intervention Required/No Barriers to Discharge, Sudden Infant Death Syndrome (SIDS) Education, Perinatal Mood and Anxiety Disorder (PMADs) Education, Hospital Drug Screen Policy Information, CSW Will Continue to Monitor Umbilical Cord Tissue Drug Screen Results and Make Report if Lorrine Kin 01/08/2019, 9:46 AM

## 2019-01-08 NOTE — Anesthesia Postprocedure Evaluation (Signed)
Anesthesia Post Note  Patient: Tammy Moon  Procedure(s) Performed: AN AD HOC LABOR EPIDURAL     Patient location during evaluation: Mother Baby Anesthesia Type: Epidural Level of consciousness: awake Pain management: satisfactory to patient Vital Signs Assessment: post-procedure vital signs reviewed and stable Respiratory status: spontaneous breathing Cardiovascular status: stable Anesthetic complications: no    Last Vitals:  Vitals:   01/08/19 0000 01/08/19 0538  BP: 137/74 122/70  Pulse: 88 89  Resp: 18 18  Temp: 36.7 C 36.8 C  SpO2: 98% 100%    Last Pain:  Vitals:   01/08/19 0538  TempSrc: Oral  PainSc: 0-No pain   Pain Goal: Patients Stated Pain Goal: 6 (01/07/19 0000)                 Casimer Lanius

## 2019-01-08 NOTE — Lactation Note (Signed)
This note was copied from a baby's chart. Lactation Consultation Note Baby 17 hrs old. Mom states BF going good. Having no difficulty or painful latches. Mom states baby prefers Rt. Nipple more but mom is latching to both. Mom has everted nipples. Mom demonstrated hand expression w/colostrum noted. Noted baby has thick labial frenulum and heart shaped tongue. Has good mobility of tongue in mouth moving tongue past gums, hasn't seen out past lips.  Assisted mom in football position using support. Newborn behavior, STS, I&O, breast massage, positioning, body alignment, supply and demand. Mom encouraged to feed baby 8-12 times/24 hours and with feeding cues.  Answered mom's questions.  Lactation brochure given. Encouraged to call for questions or concerns.  Patient Name: Tammy Moon [id number] Date: 01/08/2019 Reason for consult: Initial assessment;Primapara;Term   Maternal Data Has patient been taught Hand Expression?: Yes Does the patient have breastfeeding experience prior to this delivery?: No  Feeding Feeding Type: Breast Fed  LATCH Score Latch: Grasps breast easily, tongue down, lips flanged, rhythmical sucking.  Audible Swallowing: A few with stimulation  Type of Nipple: Everted at rest and after stimulation  Comfort (Breast/Nipple): Soft / non-tender  Hold (Positioning): Assistance needed to correctly position infant at breast and maintain latch.  LATCH Score: 8  Interventions Interventions: Breast feeding basics reviewed;Adjust position;Support pillows;Skin to skin;Position options;Breast massage;Hand express;Breast compression  Lactation Tools Discussed/Used WIC Program: Yes   Consult Status Consult Status: Follow-up Date: 01/09/19 Follow-up type: In-patient    Theodoro Kalata 01/08/2019, 3:39 AM

## 2019-01-10 ENCOUNTER — Encounter: Payer: Medicaid Other | Admitting: Certified Nurse Midwife

## 2019-01-11 ENCOUNTER — Other Ambulatory Visit: Payer: Self-pay

## 2019-01-11 ENCOUNTER — Ambulatory Visit (INDEPENDENT_AMBULATORY_CARE_PROVIDER_SITE_OTHER): Payer: Medicaid Other

## 2019-01-11 VITALS — BP 121/77 | HR 92

## 2019-01-11 DIAGNOSIS — Z0130 Encounter for examination of blood pressure without abnormal findings: Secondary | ICD-10-CM

## 2019-01-11 NOTE — Addendum Note (Signed)
Addended by: Delrae Alfred on: 01/11/2019 09:47 AM   Modules accepted: Level of Service

## 2019-01-11 NOTE — Progress Notes (Signed)
Subjective:  Tammy Moon is a 23 y.o. female here for BP check.   Hypertension ROS: no medication side effects noted, patient does not perform home BP monitoring, no TIA's, no chest pain on exertion, no dyspnea on exertion, no swelling of ankles and no palpitations.    Objective:  LMP 03/31/2018   Appearance alert, well appearing, and in no distress and normal appearing weight. General exam BP noted to be well controlled today in office.    Assessment:   Blood Pressure well controlled.   Plan:  Follow up: 3 week and as needed. during PP visit.

## 2019-02-08 ENCOUNTER — Other Ambulatory Visit: Payer: Self-pay

## 2019-02-08 ENCOUNTER — Ambulatory Visit (INDEPENDENT_AMBULATORY_CARE_PROVIDER_SITE_OTHER): Payer: Medicaid Other

## 2019-02-08 DIAGNOSIS — Z30011 Encounter for initial prescription of contraceptive pills: Secondary | ICD-10-CM

## 2019-02-08 DIAGNOSIS — Z1389 Encounter for screening for other disorder: Secondary | ICD-10-CM | POA: Diagnosis not present

## 2019-02-08 DIAGNOSIS — Z3202 Encounter for pregnancy test, result negative: Secondary | ICD-10-CM

## 2019-02-08 LAB — POCT URINE PREGNANCY: Preg Test, Ur: NEGATIVE

## 2019-02-08 MED ORDER — NORETHINDRONE 0.35 MG PO TABS: 1.0000 | ORAL_TABLET | Freq: Every day | ORAL | 11 refills | Status: AC

## 2019-02-08 NOTE — Progress Notes (Signed)
   Subjective:     Tammy Moon is a 23 y.o. female who presents for a postpartum visit. She is 4 weeks postpartum following a spontaneous vaginal delivery. I have fully reviewed the prenatal and intrapartum course. The delivery was at 40.2 gestational weeks. Outcome: spontaneous vaginal delivery. Anesthesia: epidural. Postpartum course has been Unremarkable. Baby's course has been Unremarkable. Baby is feeding by breast. Bleeding staining only in the form of spotting.  She reports some intermittent cramping, but is having no pain.  Bowel function is normal. Bladder function is normal. Patient is not sexually active. Contraception method is none. Postpartum depression screening: negative.  She denies issues with breastfeeding and declines a breast exam stating her nipples are intact.  She reports no issues or discomfort with her laceration and declines provider examination of the area.    The following portions of the patient's history were reviewed and updated as appropriate: allergies, current medications, past family history, past medical history, past social history, past surgical history and problem list.  Review of Systems Pertinent items noted in HPI and remainder of comprehensive ROS otherwise negative.   Objective:    BP 119/66   Pulse 72   Ht 5\' 10"  (1.778 m)   Wt 197 lb (89.4 kg)   LMP 03/31/2018   Breastfeeding Yes   BMI 28.27 kg/m   General:  alert, cooperative and no distress   Breasts:  Not Inspected  Lungs: clear to auscultation bilaterally  Heart:  regular rate and rhythm  Abdomen: normal findings: bowel sounds normal and soft, non-tender   Vulva:  not evaluated  Vagina: not evaluated  Cervix:  Not Evaluated  Corpus: not examined  Adnexa:  not evaluated  Rectal Exam: Not performed.        Assessment:     23 year old Postpartum Exam Normal Involution Breastfeeding Desires Oral Contraception  Plan:    1. Contraception: oral progesterone-only contraceptive   Rx for Micronor sent to pharmacy on file. 2. Pap Smear UTD 3. Follow up in: 1 year for annual exam or as needed.

## 2019-02-08 NOTE — Patient Instructions (Signed)
Contraception Choices Contraception, also called birth control, refers to methods or devices that prevent pregnancy. Hormonal methods Contraceptive implant  A contraceptive implant is a thin, plastic tube that contains a hormone. It is inserted into the upper part of the arm. It can remain in place for up to 3 years. Progestin-only injections Progestin-only injections are injections of progestin, a synthetic form of the hormone progesterone. They are given every 3 months by a health care provider. Birth control pills  Birth control pills are pills that contain hormones that prevent pregnancy. They must be taken once a day, preferably at the same time each day. Birth control patch  The birth control patch contains hormones that prevent pregnancy. It is placed on the skin and must be changed once a week for three weeks and removed on the fourth week. A prescription is needed to use this method of contraception. Vaginal ring  A vaginal ring contains hormones that prevent pregnancy. It is placed in the vagina for three weeks and removed on the fourth week. After that, the process is repeated with a new ring. A prescription is needed to use this method of contraception. Emergency contraceptive Emergency contraceptives prevent pregnancy after unprotected sex. They come in pill form and can be taken up to 5 days after sex. They work best the sooner they are taken after having sex. Most emergency contraceptives are available without a prescription. This method should not be used as your only form of birth control. Barrier methods Female condom  A female condom is a thin sheath that is worn over the penis during sex. Condoms keep sperm from going inside a woman's body. They can be used with a spermicide to increase their effectiveness. They should be disposed after a single use. Female condom  A female condom is a soft, loose-fitting sheath that is put into the vagina before sex. The condom keeps sperm  from going inside a woman's body. They should be disposed after a single use. Diaphragm  A diaphragm is a soft, dome-shaped barrier. It is inserted into the vagina before sex, along with a spermicide. The diaphragm blocks sperm from entering the uterus, and the spermicide kills sperm. A diaphragm should be left in the vagina for 6-8 hours after sex and removed within 24 hours. A diaphragm is prescribed and fitted by a health care provider. A diaphragm should be replaced every 1-2 years, after giving birth, after gaining more than 15 lb (6.8 kg), and after pelvic surgery. Cervical cap  A cervical cap is a round, soft latex or plastic cup that fits over the cervix. It is inserted into the vagina before sex, along with spermicide. It blocks sperm from entering the uterus. The cap should be left in place for 6-8 hours after sex and removed within 48 hours. A cervical cap must be prescribed and fitted by a health care provider. It should be replaced every 2 years. Sponge  A sponge is a soft, circular piece of polyurethane foam with spermicide on it. The sponge helps block sperm from entering the uterus, and the spermicide kills sperm. To use it, you make it wet and then insert it into the vagina. It should be inserted before sex, left in for at least 6 hours after sex, and removed and thrown away within 30 hours. Spermicides Spermicides are chemicals that kill or block sperm from entering the cervix and uterus. They can come as a cream, jelly, suppository, foam, or tablet. A spermicide should be inserted into the   vagina with an applicator at least 10-15 minutes before sex to allow time for it to work. The process must be repeated every time you have sex. Spermicides do not require a prescription. Intrauterine contraception Intrauterine device (IUD) An IUD is a T-shaped device that is put in a woman's uterus. There are two types:  Hormone IUD.This type contains progestin, a synthetic form of the hormone  progesterone. This type can stay in place for 3-5 years.  Copper IUD.This type is wrapped in copper wire. It can stay in place for 10 years.  Permanent methods of contraception Female tubal ligation In this method, a woman's fallopian tubes are sealed, tied, or blocked during surgery to prevent eggs from traveling to the uterus. Hysteroscopic sterilization In this method, a small, flexible insert is placed into each fallopian tube. The inserts cause scar tissue to form in the fallopian tubes and block them, so sperm cannot reach an egg. The procedure takes about 3 months to be effective. Another form of birth control must be used during those 3 months. Female sterilization This is a procedure to tie off the tubes that carry sperm (vasectomy). After the procedure, the man can still ejaculate fluid (semen). Natural planning methods Natural family planning In this method, a couple does not have sex on days when the woman could become pregnant. Calendar method This means keeping track of the length of each menstrual cycle, identifying the days when pregnancy can happen, and not having sex on those days. Ovulation method In this method, a couple avoids sex during ovulation. Symptothermal method This method involves not having sex during ovulation. The woman typically checks for ovulation by watching changes in her temperature and in the consistency of cervical mucus. Post-ovulation method In this method, a couple waits to have sex until after ovulation. Summary  Contraception, also called birth control, means methods or devices that prevent pregnancy.  Hormonal methods of contraception include implants, injections, pills, patches, vaginal rings, and emergency contraceptives.  Barrier methods of contraception can include female condoms, female condoms, diaphragms, cervical caps, sponges, and spermicides.  There are two types of IUDs (intrauterine devices). An IUD can be put in a woman's uterus to  prevent pregnancy for 3-5 years.  Permanent sterilization can be done through a procedure for males, females, or both.  Natural family planning methods involve not having sex on days when the woman could become pregnant. This information is not intended to replace advice given to you by your health care provider. Make sure you discuss any questions you have with your health care provider. Document Released: 03/07/2005 Document Revised: 03/09/2017 Document Reviewed: 04/09/2016 Elsevier Patient Education  2020 [employer]  

## 2019-03-18 DIAGNOSIS — Z1152 Encounter for screening for COVID-19: Secondary | ICD-10-CM | POA: Diagnosis not present

## 2020-03-12 DIAGNOSIS — U071 COVID-19: Secondary | ICD-10-CM | POA: Diagnosis not present

## 2020-10-11 IMAGING — US US MFM OB COMP + 14 WK
1 series · 13 of 28 positions shown · non-contrast
Comparison: none

[Series 1: us mfm ob comp + 14 wk · 13 of 78 slices shown]
[im 3/78]
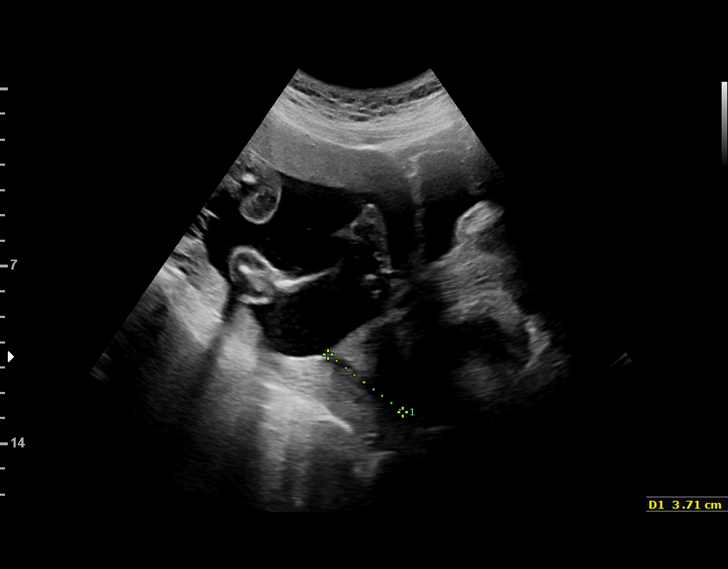
[im 9/78]
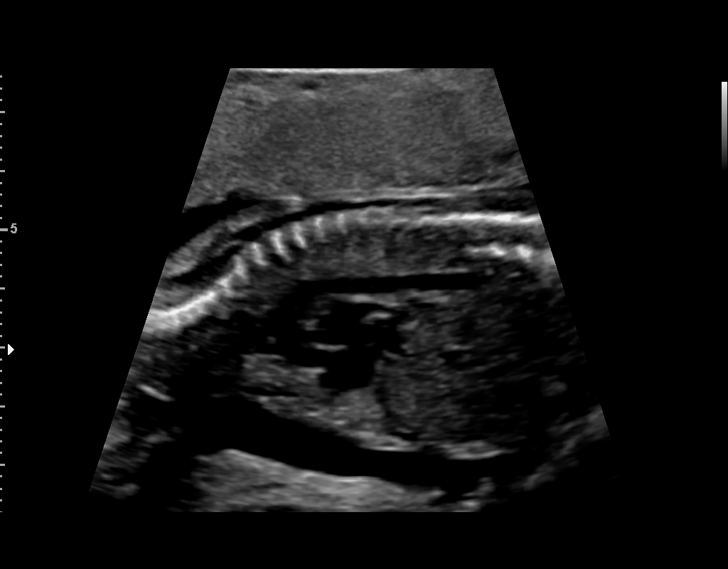
[im 15/78]
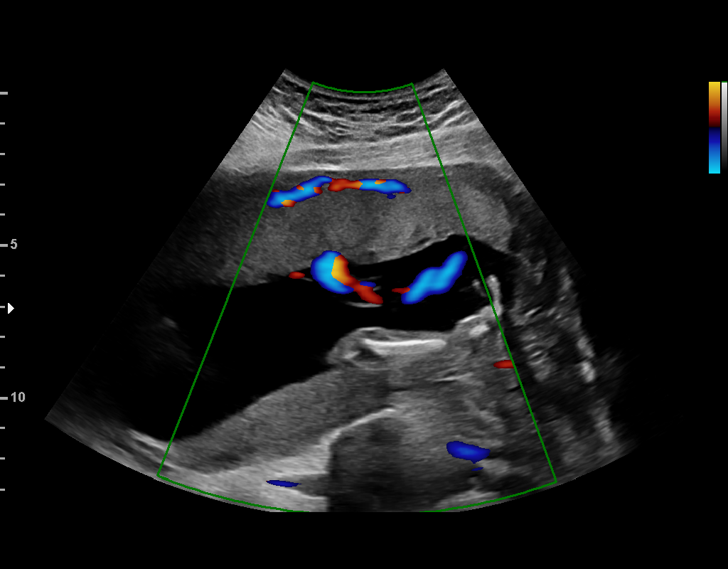
[im 20/78]
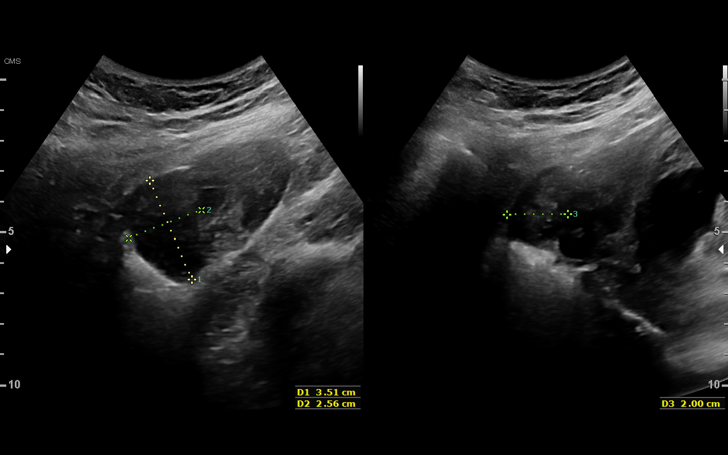
[im 26/78]
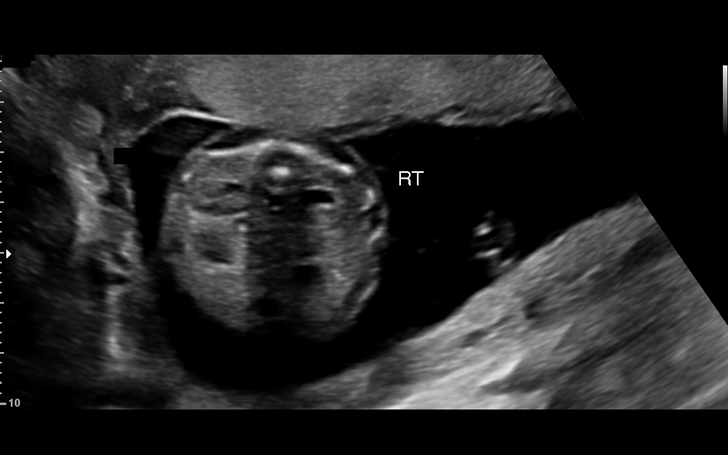
[im 32/78]
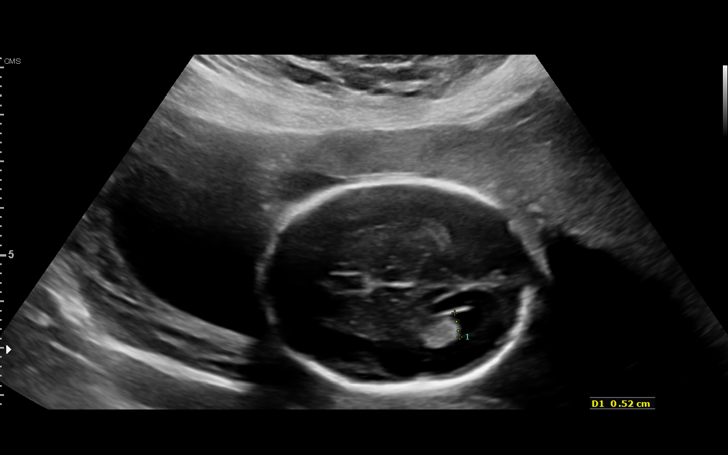
[im 40/78]
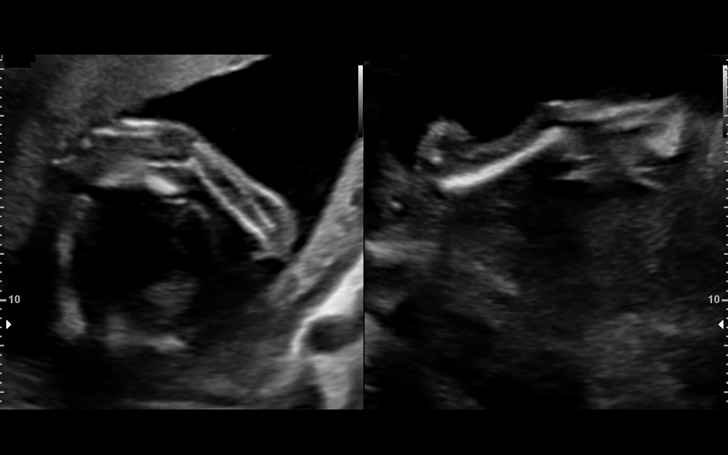
[im 46/78]
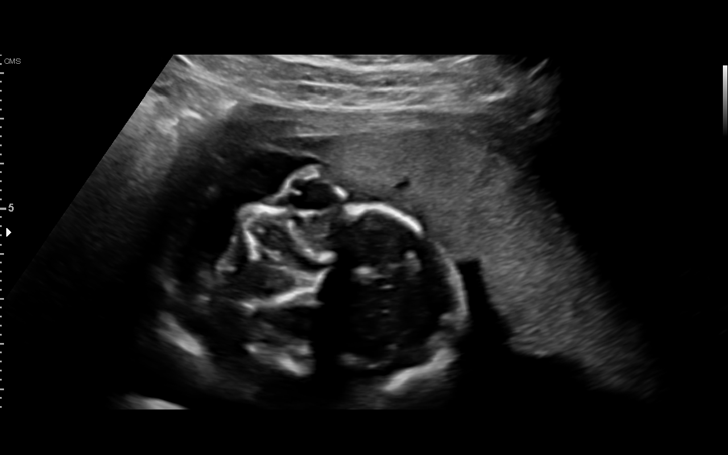
[im 52/78]
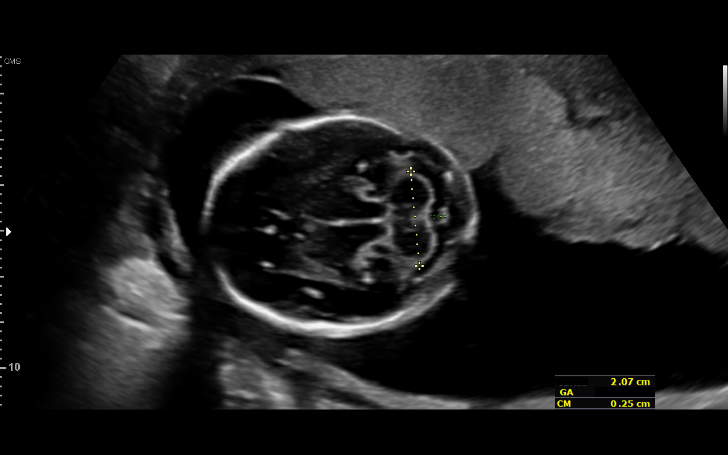
[im 58/78]
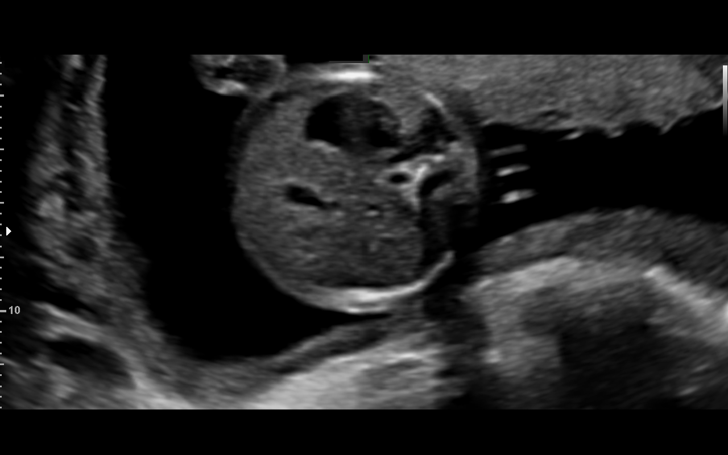
[im 63/78]
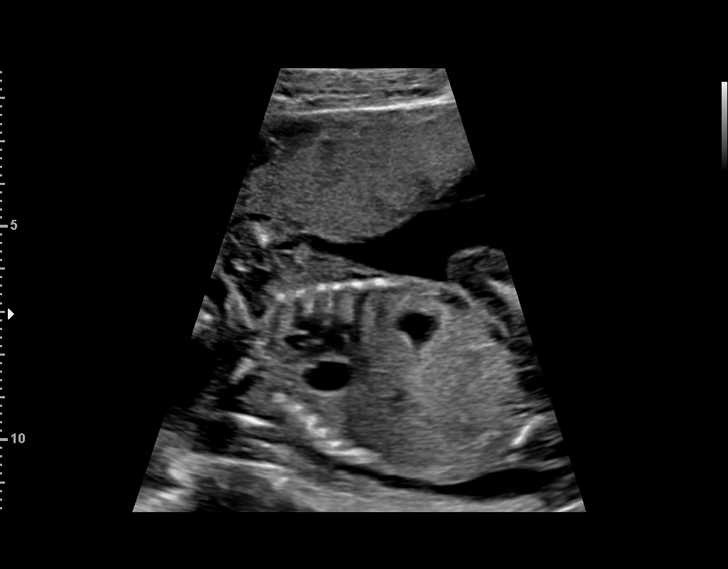
[im 69/78]
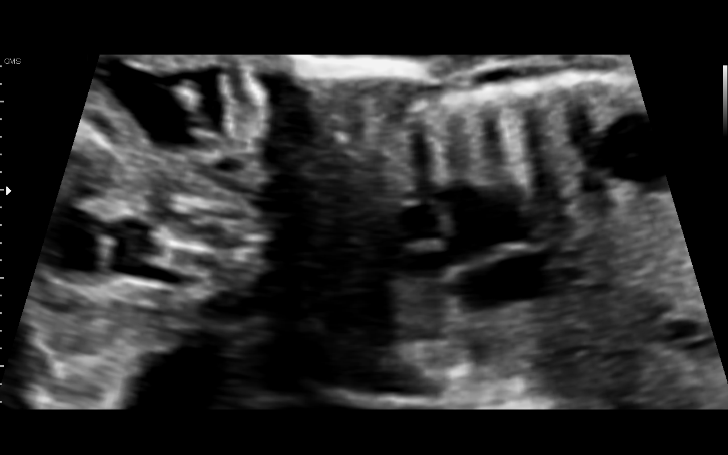
[im 75/78]
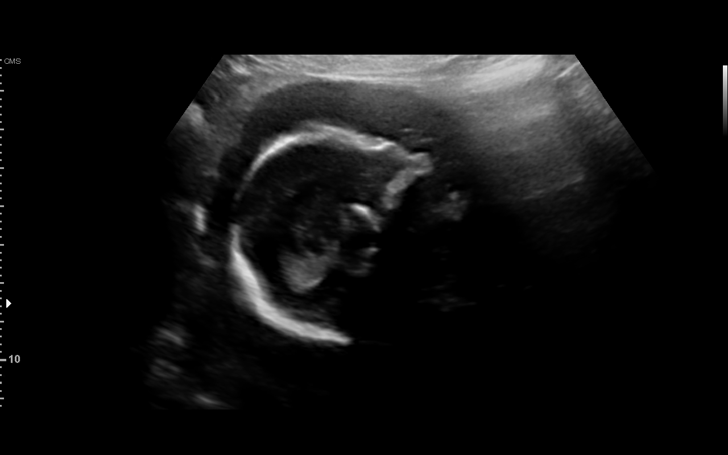

[13 of 28 positions shown; findings below may reference images not displayed]

1  US MFM OB COMP + 14 WK                76805.01    GASTON ANTONIO BINIMELIS
 ----------------------------------------------------------------------

 ----------------------------------------------------------------------
Indications

  Encounter for antenatal screening for
  malformations
  19 weeks gestation of pregnancy
 ----------------------------------------------------------------------
Fetal Evaluation

 Num Of Fetuses:          1
 Fetal Heart              150
 Rate(bpm):
 Cardiac Activity:        Observed
 Presentation:            Breech
 Placenta:                Anterior
 P. Cord Insertion:       Visualized

 Amniotic Fluid
 AFI FV:      Within normal limits

                             Largest Pocket(cm)

Biometry

 BPD:      45.4  mm     G. Age:  19w 5d         63  %    CI:         70.07  %    70 - 86
                                                         FL/HC:       20.0  %    16.1 -
 HC:        173  mm     G. Age:  19w 6d         63  %    HC/AC:       1.12       1.09 -
 AC:      154.7  mm     G. Age:  20w 5d         82  %    FL/BPD:      76.2  %
 FL:       34.6  mm     G. Age:  20w 6d         88  %    FL/AC:       22.4  %    20 - 24
 HUM:      30.3  mm     G. Age:  20w 0d         66  %
 CER:      20.7  mm     G. Age:  19w 5d         55  %
 NFT:       4.6  mm
 CM:        3.6  mm

 Est. FW:     367   g    0 lb 13 oz      64  %
                    m
OB History

 Gravidity:    2         Term:   0        Prem:   0         SAB:   1
 TOP:          0       Ectopic:  0        Living: 0
Gestational Age

 LMP:           19w 3d        Date:  03/31/18                 EDD:    01/05/19
 U/S Today:     20w 2d                                        EDD:    12/30/18
 Best:          19w 3d     Det. By:  LMP  (03/31/18)          EDD:    01/05/19
Anatomy

 Cranium:               Appears normal         LVOT:                   Not well visualized
 Cavum:                 Appears normal         Aortic Arch:            Appears normal
 Ventricles:            Appears normal         Ductal Arch:            Not well visualized
 Choroid Plexus:        Appears normal         Diaphragm:              Appears normal
 Cerebellum:            Appears normal         Stomach:                Appears normal,
                                                                       left sided
 Posterior Fossa:       Appears normal         Abdomen:                Appears normal
 Nuchal Fold:           Appears normal         Abdominal Wall:         Appears nml (cord
                                                                       insert, abd wall)
 Face:                  Orbits nl; profile not Cord Vessels:           Appears normal (3
                        well visualized                                vessel cord)
 Lips:                  Appears normal         Kidneys:                Appear normal
 Palate:                Not well visualized    Bladder:                Appears normal
 Thoracic:              Appears normal         Spine:                  Appears normal
 Heart:                 Appears normal         Upper Extremities:      Appears normal
                        (4CH, axis, and
                        situs)
 RVOT:                  Not well visualized    Lower Extremities:      Appears normal

 Other:  Heels and right 5th digit visualized. Nasal bone visualized.
         Technically difficult due to fetal position.
Cervix Uterus Adnexa

 Cervix
 Length:            3.4  cm.
 Normal appearance by transabdominal scan.

 Uterus
 No abnormality visualized.

 Left Ovary
 Not visualized. No adnexal mass visualized.

 Right Ovary
 Within normal limits.

 Cul De Sac
 No free fluid seen.
 Adnexa
 No abnormality visualized.
Impression

 We performed fetal anatomy scan. No makers of
 aneuploidies or fetal structural defects are seen. Fetal
 biometry is consistent with her previously-established dates.
 Amniotic fluid is normal and good fetal activity is seen.

 On cell-free fetal DNA screening, the risks of fetal
 aneuploidies are not increased.
Recommendations

 An appointment was made for her to return in 4 weeks for
 completion of fetal anatomy.
                 Stabler, [place]

## 2020-11-08 IMAGING — US US MFM OB FOLLOW UP
1 series · 14 of 28 positions shown · non-contrast
Comparison: none

[Series 1: us mfm ob follow up · 36 acquisitions, 14 frames shown]
[im 2/36]
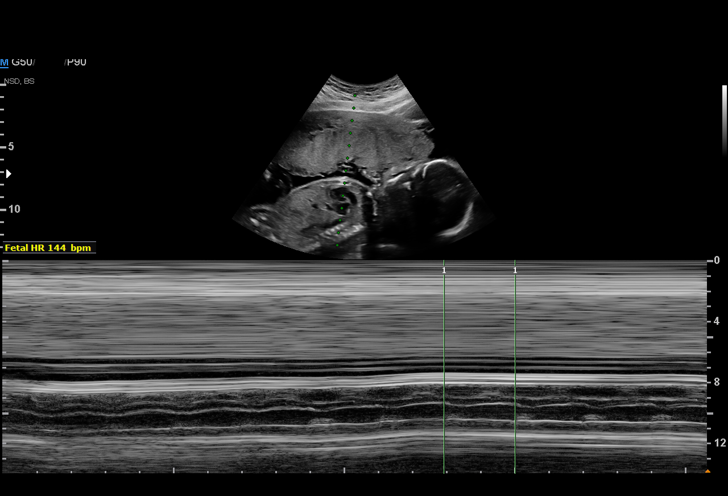
[im 4/36]
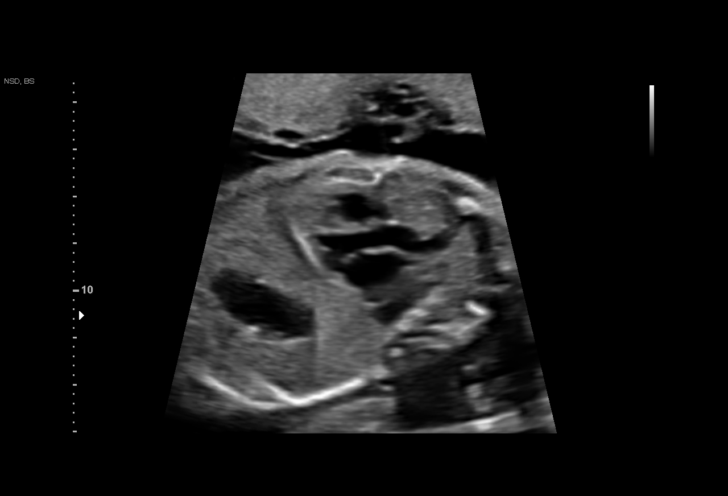
[im 7/36]
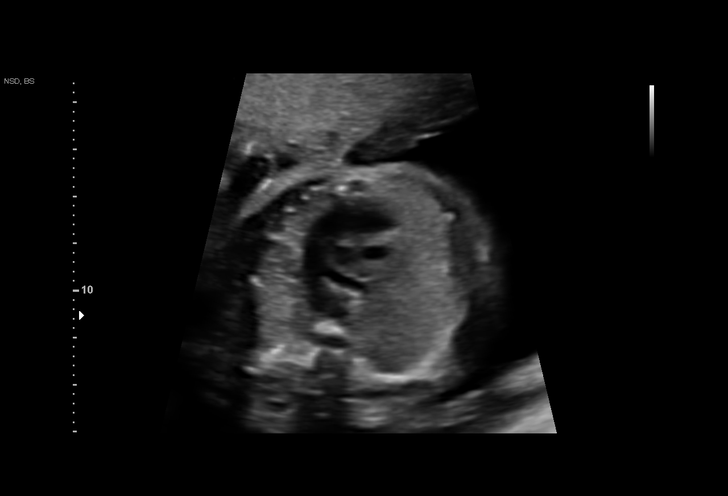
[im 10/36]
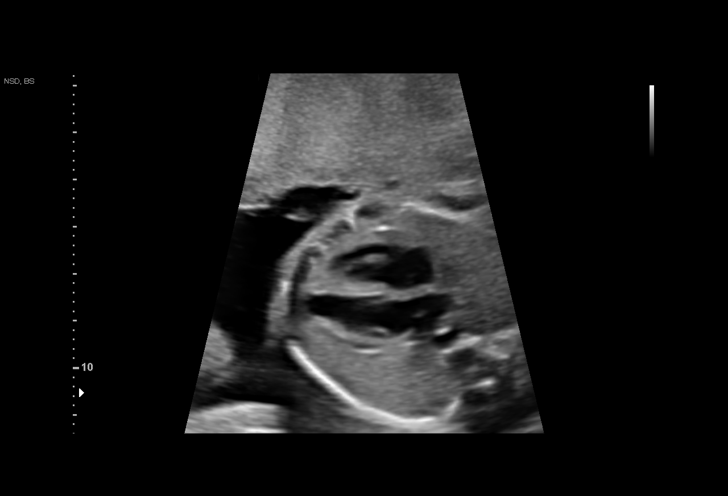
[im 12/36]
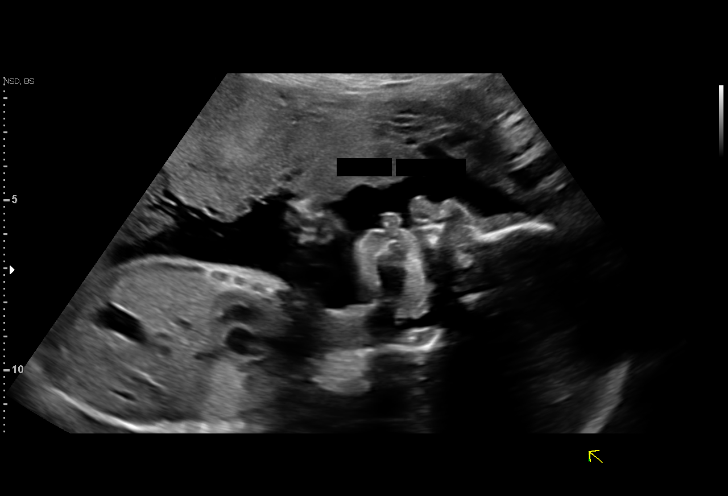
[im 15/36]
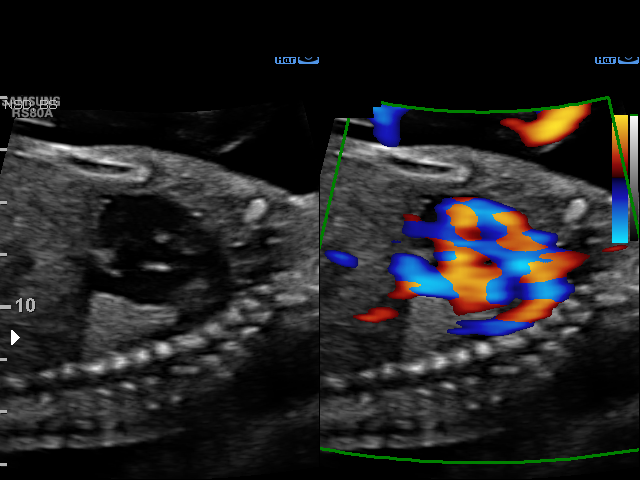
[im 17/36]
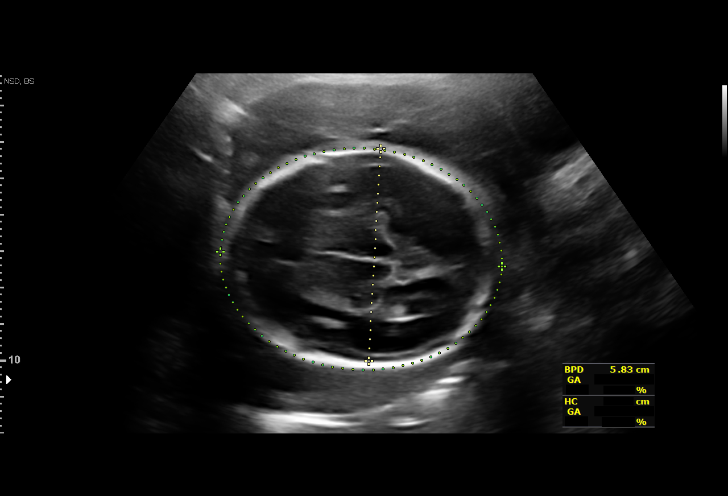
[im 20/36]
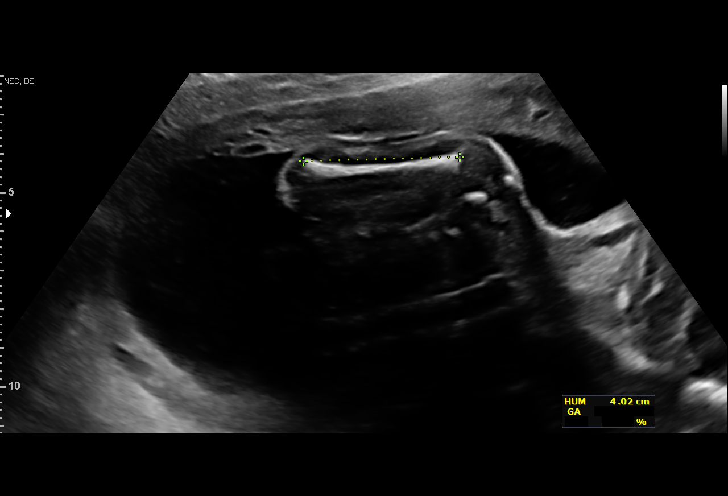
[im 23/36]
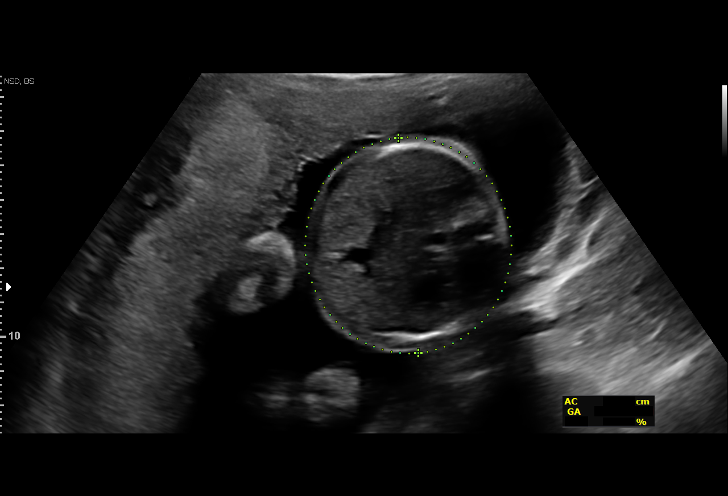
[im 25/36]
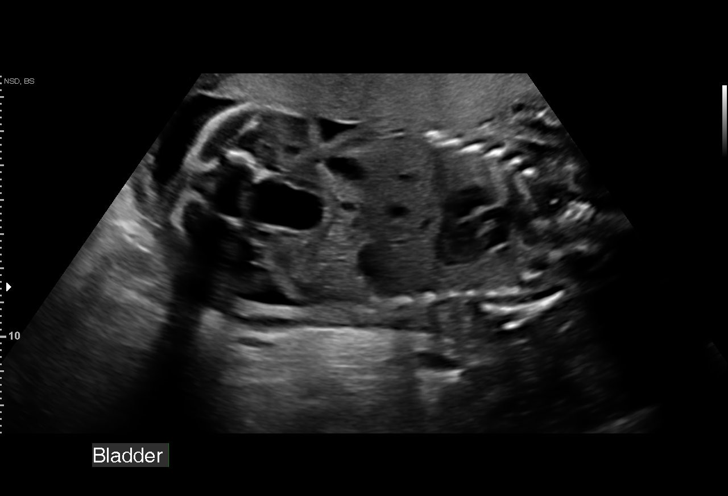
[im 28/36]
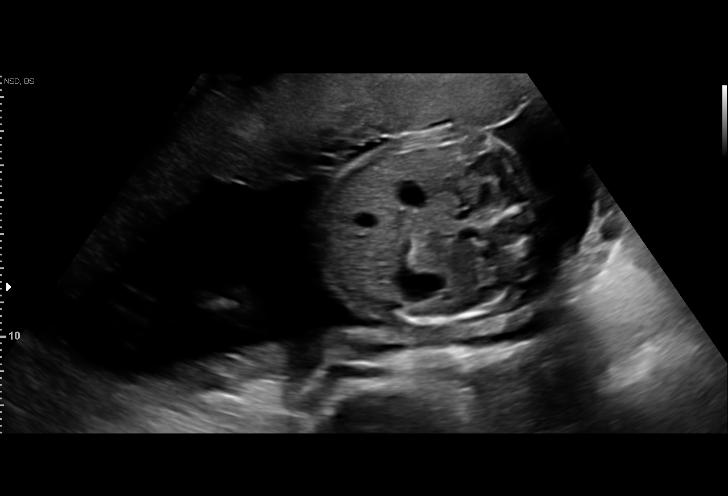
[im 30/36]
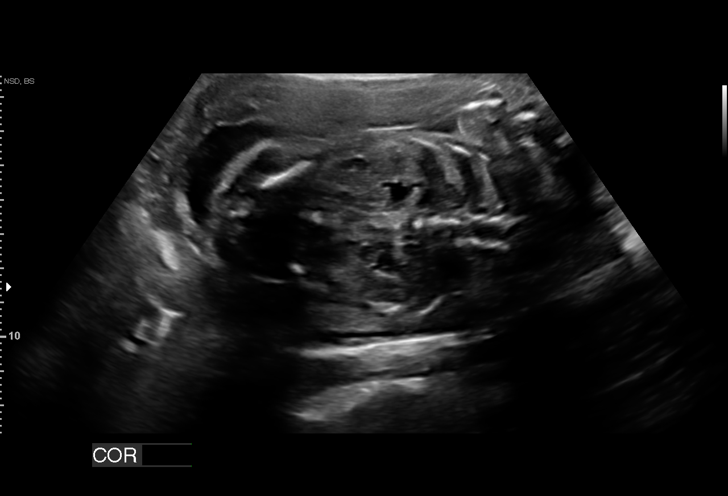
[im 33/36]
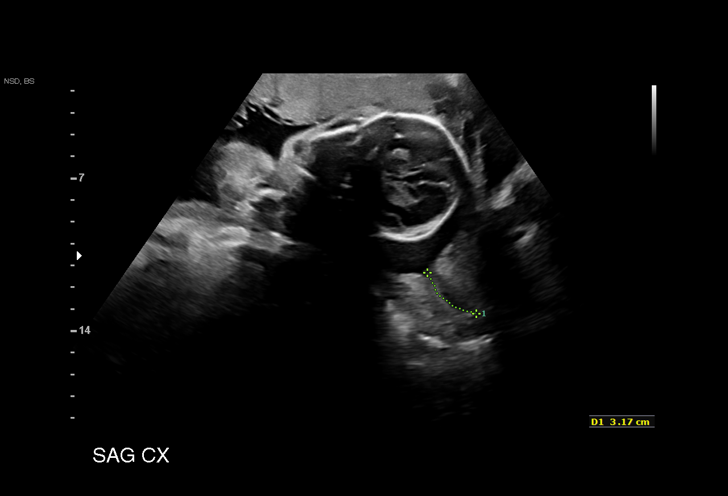
[im 36/36]
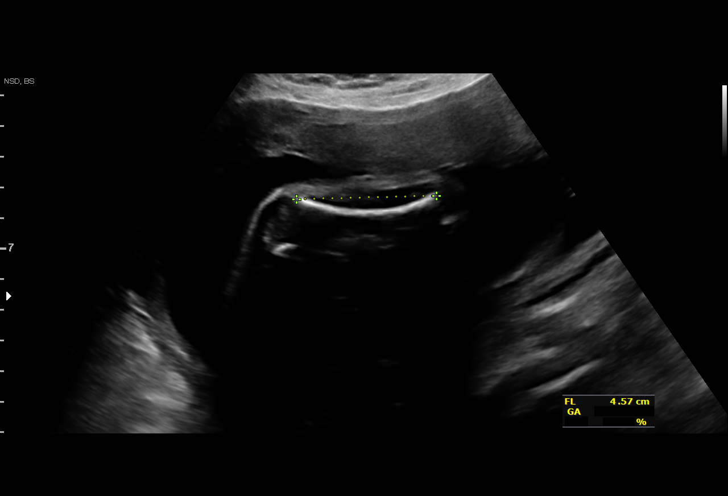

[14 of 28 positions shown; findings below may reference images not displayed]

----------------------------------------------------------------------

 ----------------------------------------------------------------------
Indications

  Encounter for antenatal screening for
  malformations (LOW risk NIPS)
  23 weeks gestation of pregnancy
  Antenatal follow-up for nonvisualized fetal
  anatomy
  Genetic carrier (CF and Sickle cell)
 ----------------------------------------------------------------------
Fetal Evaluation

 Num Of Fetuses:          1
 Fetal Heart Rate(bpm):   144
 Cardiac Activity:        Observed
 Presentation:            Cephalic
 Placenta:                Anterior
 P. Cord Insertion:       Previously Visualized

 Amniotic Fluid
 AFI FV:      Within normal limits

                             Largest Pocket(cm)

Biometry

 BPD:      57.8  mm     G. Age:  23w 5d         55  %    CI:        73.21   %    70 - 86
                                                         FL/HC:       21.3  %    19.2 -
 HC:      214.7  mm     G. Age:  23w 4d         38  %    HC/AC:       1.09       1.05 -
 AC:      197.5  mm     G. Age:  24w 3d         72  %    FL/BPD:      79.1  %    71 - 87
 FL:       45.7  mm     G. Age:  25w 1d         87  %    FL/AC:       23.1  %    20 - 24
 HUM:      40.1  mm     G. Age:  24w 3d         64  %
 LV:        4.9  mm
 Est. FW:     709   gm     1 lb 9 oz     72  %
OB History

 Gravidity:    2         Term:   0        Prem:   0        SAB:   1
 TOP:          0       Ectopic:  0        Living: 0
Gestational Age

 LMP:           23w 3d        Date:  03/31/18                 EDD:   01/05/19
 U/S Today:     24w 2d                                        EDD:   12/30/18
 Best:          23w 3d     Det. By:  LMP  (03/31/18)          EDD:   01/05/19
Anatomy

 Cranium:               Appears normal         LVOT:                   Appears normal
 Cavum:                 Previously seen        Aortic Arch:            Previously seen
 Ventricles:            Appears normal         Ductal Arch:            Appears normal
 Choroid Plexus:        Previously seen        Diaphragm:              Appears normal
 Cerebellum:            Previously seen        Stomach:                Appears normal, left
                                                                       sided
 Posterior Fossa:       Previously seen        Abdomen:                Appears normal
 Nuchal Fold:           Previously seen        Abdominal Wall:         Previously seen
 Face:                  Orbits previously      Cord Vessels:           Previously seen
                        seen. Profile WNL
 Lips:                  Previously seen        Kidneys:                Appear normal
 Palate:                Not well visualized    Bladder:                Appears normal
 Thoracic:              Appears normal         Spine:                  Previously seen
 Heart:                 Appears normal         Upper Extremities:      Previously seen
                        (4CH, axis, and
                        situs)
 RVOT:                  Appears normal         Lower Extremities:      Previously seen

 Other:  Heels and right 5th digit previously visualized. Nasal bone previously
         visualized.
Cervix Uterus Adnexa

 Cervix
 Normal appearance by transabdominal scan.
Impression

 Normal interval growth.
Recommendations

 Follow up growth as clinically indicated.
